# Patient Record
Sex: Male | Born: 1955 | Race: White | Hispanic: No | Smoking: Never smoker
Health system: Southern US, Community
[De-identification: ages and names within clinical notes are randomized; demographics above are authoritative.]

## PROBLEM LIST (undated history)

## (undated) DIAGNOSIS — Z86010 Personal history of colonic polyps: Secondary | ICD-10-CM

## (undated) HISTORY — DX: Personal history of colonic polyps: Z86.010

---

## 1999-01-30 ENCOUNTER — Emergency Department (HOSPITAL_COMMUNITY): Admission: EM | Admit: 1999-01-30 | Discharge: 1999-01-30 | Payer: Self-pay | Admitting: Emergency Medicine

## 1999-01-30 ENCOUNTER — Encounter: Payer: Self-pay | Admitting: Emergency Medicine

## 2006-09-05 DIAGNOSIS — Z860100 Personal history of colon polyps, unspecified: Secondary | ICD-10-CM

## 2006-09-05 HISTORY — DX: Personal history of colon polyps, unspecified: Z86.0100

## 2006-09-05 HISTORY — DX: Personal history of colonic polyps: Z86.010

## 2006-09-05 HISTORY — PX: COLONOSCOPY W/ POLYPECTOMY: SHX1380

## 2006-12-07 ENCOUNTER — Ambulatory Visit: Payer: Self-pay | Admitting: Internal Medicine

## 2006-12-07 LAB — CONVERTED CEMR LAB
ALT: 10 units/L (ref 0–40)
AST: 23 units/L (ref 0–37)
Albumin: 4.2 g/dL (ref 3.5–5.2)
Alkaline Phosphatase: 44 units/L (ref 39–117)
BUN: 15 mg/dL (ref 6–23)
Basophils Absolute: 0 10*3/uL (ref 0.0–0.1)
Basophils Relative: 0.8 % (ref 0.0–1.0)
Bilirubin, Direct: 0.2 mg/dL (ref 0.0–0.3)
CO2: 29 meq/L (ref 19–32)
Calcium: 9.3 mg/dL (ref 8.4–10.5)
Chloride: 104 meq/L (ref 96–112)
Cholesterol: 168 mg/dL (ref 0–200)
Creatinine, Ser: 1 mg/dL (ref 0.4–1.5)
Eosinophils Absolute: 0.2 10*3/uL (ref 0.0–0.6)
Eosinophils Relative: 4.5 % (ref 0.0–5.0)
GFR calc Af Amer: 101 mL/min
GFR calc non Af Amer: 84 mL/min
Glucose, Bld: 108 mg/dL — ABNORMAL HIGH (ref 70–99)
HCT: 47.2 % (ref 39.0–52.0)
HDL: 51.4 mg/dL (ref >39.0)
Hemoglobin: 16.3 g/dL (ref 13.0–17.0)
LDL Cholesterol: 104 mg/dL — ABNORMAL HIGH (ref 0–99)
Lymphocytes Relative: 31.8 % (ref 12.0–46.0)
MCHC: 34.6 g/dL (ref 30.0–36.0)
MCV: 92.6 fL (ref 78.0–100.0)
Monocytes Absolute: 0.5 10*3/uL (ref 0.2–0.7)
Monocytes Relative: 8.7 % (ref 3.0–11.0)
Neutro Abs: 3 10*3/uL (ref 1.4–7.7)
Neutrophils Relative %: 54.2 % (ref 43.0–77.0)
PSA: 1.11 ng/mL (ref 0.10–4.00)
Platelets: 250 10*3/uL (ref 150–400)
Potassium: 3.8 meq/L (ref 3.5–5.1)
RBC: 5.09 M/uL (ref 4.22–5.81)
RDW: 11.7 % (ref 11.5–14.6)
Sodium: 139 meq/L (ref 135–145)
TSH: 2.39 microintl units/mL (ref 0.35–5.50)
Total Bilirubin: 1 mg/dL (ref 0.3–1.2)
Total CHOL/HDL Ratio: 3.3
Total Protein: 6.7 g/dL (ref 6.0–8.3)
Triglycerides: 61 mg/dL (ref 0–149)
VLDL: 12 mg/dL (ref 0–40)
WBC: 5.4 10*3/uL (ref 4.5–10.5)

## 2007-01-04 ENCOUNTER — Ambulatory Visit: Payer: Self-pay | Admitting: Internal Medicine

## 2007-01-18 ENCOUNTER — Ambulatory Visit: Payer: Self-pay | Admitting: Internal Medicine

## 2007-01-18 ENCOUNTER — Encounter: Payer: Self-pay | Admitting: Internal Medicine

## 2008-12-16 ENCOUNTER — Ambulatory Visit: Payer: Self-pay | Admitting: Internal Medicine

## 2008-12-16 DIAGNOSIS — Z860100 Personal history of colon polyps, unspecified: Secondary | ICD-10-CM | POA: Insufficient documentation

## 2008-12-16 LAB — CONVERTED CEMR LAB
Bilirubin Urine: NEGATIVE
Blood in Urine, dipstick: NEGATIVE
Glucose, Urine, Semiquant: NEGATIVE
Ketones, urine, test strip: NEGATIVE
Nitrite: NEGATIVE
Protein, U semiquant: NEGATIVE
Specific Gravity, Urine: 1.01
Urobilinogen, UA: 0.2
WBC Urine, dipstick: NEGATIVE
pH: 6

## 2009-12-16 ENCOUNTER — Encounter: Payer: Self-pay | Admitting: Internal Medicine

## 2010-10-05 NOTE — Letter (Signed)
Summary: Medical Certificate Third Class/FAA  Medical Certificate Third Class/FAA   Imported By: Lanelle Bal 12/21/2009 11:45:04  _____________________________________________________________________  External Attachment:    Type:   Image     Comment:   External Document

## 2010-12-22 ENCOUNTER — Telehealth: Payer: Self-pay

## 2010-12-22 ENCOUNTER — Ambulatory Visit (INDEPENDENT_AMBULATORY_CARE_PROVIDER_SITE_OTHER): Payer: BC Managed Care – PPO | Admitting: Internal Medicine

## 2010-12-22 ENCOUNTER — Encounter: Payer: Self-pay | Admitting: Internal Medicine

## 2010-12-22 DIAGNOSIS — Z0000 Encounter for general adult medical examination without abnormal findings: Secondary | ICD-10-CM

## 2010-12-22 DIAGNOSIS — N429 Disorder of prostate, unspecified: Secondary | ICD-10-CM

## 2010-12-22 DIAGNOSIS — R7309 Other abnormal glucose: Secondary | ICD-10-CM

## 2010-12-22 NOTE — Patient Instructions (Signed)
Please use the exercises and arch supports for plantar fasciitis.

## 2010-12-22 NOTE — Progress Notes (Signed)
Subjective:    Patient ID: Marcus Raymond, male    DOB: 1955/11/20, 55 y.o.   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    Review of Systems Patient reports no  vision/ hearing changes,anorexia, weight change, fever ,adenopathy, persistant / recurrent hoarseness, swallowing issues, chest pain,palpitations, edema,persistant / recurrent cough, hemoptysis, dyspnea(rest, exertional, paroxysmal nocturnal), gastrointestinal  bleeding (melena, rectal bleeding), abdominal pain, excessive heart burn, GU symptoms( dysuria, hematuria, pyuria, voiding/incontinence  issues) syncope, focal weakness, memory loss,numbness & tingling, skin/hair/nail changes,depression, anxiety, abnormal bruising/bleeding, musculoskeletal symptoms/signs( see Plantar Fasciitis issue)     Objective:   Physical Exam Gen.: Thin but healthy and well-nourished in appearance. Alert, appropriate and cooperative throughout exam. Head: Normocephalic without obvious abnormalities;  no alopecia  Eyes: No corneal or conjunctival inflammation noted. Pupils equal round reactive to light and accommodation. Fundal exam is benign without hemorrhages, exudate, papilledema. Extraocular motion intact. Vision grossly normal. Ears: External  ear exam reveals no significant lesions or deformities. Canals clear .TMs normal. Hearing is grossly normal bilaterally. Nose: External nasal exam reveals no deformity or inflammation. Nasal mucosa are pink and moist. No lesions or exudates noted. Septum   Minimal  dislocation to L.  Mouth: Oral mucosa and oropharynx reveal no lesions or exudates. Teeth in good repair. Neck: No deformities, masses, or tenderness noted. Range of motion normal. Thyroid w/o nodules. Lungs: Normal respiratory effort; chest expands symmetrically. Lungs are clear to auscultation without rales, wheezes, or increased work of breathing. Heart: Normal  rate and rhythm. Prominent  S1; normal  S2. No gallop, click, or rub.  No murmur. Abdomen: Bowel sounds normal; abdomen soft and nontender. No masses, organomegaly or hernias noted. External hemorrhoidal tags Genitalia: Normal genitalia; L prostate lobe firm w/o nodules or enlargement.                                                                                     Musculoskeletal/extremities: No deformity or scoliosis noted of  the thoracic or lumbar spine. No clubbing, cyanosis, edema, or deformity noted. Range of motion  normal .Tone & strength  normal.Joints normal. Nail health  good. Vascular: Carotid, radial artery, dorsalis pedis and dorsalis posterior tibial pulses are full and equal.Aorta palpable w/o AAA. No bruits present. Neurologic: Alert and oriented x3. Deep tendon reflexes symmetrical and normal.  Romberg  ;finger to nose normal.        Skin: Intact without suspicious lesions or rashes. Lymph: No cervical, axillary, or inguinal lymphadenopathy present. Psych: Mood and affect are normal. Normally interactive                                                                                         Assessment & Plan:  #1 comprehensive  physical exam; no contraindication to [facility] certification  #2 subtle changes in the left lobe of the prostate  #3 fasting hyperglycemia, past medical history of  Plan:#1 His data will be entered into the [facility] computer system & certificate mailed to him  #2 A1c and PSA will be collected.

## 2010-12-22 NOTE — Telephone Encounter (Signed)
I called patient and left a voicemail for a return call. 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      KP

## 2010-12-23 LAB — PSA: PSA: 0.72 ng/mL (ref 0.10–4.00)

## 2010-12-23 LAB — HEMOGLOBIN A1C: Hgb A1c MFr Bld: 5.9 % (ref 4.6–6.5)

## 2010-12-23 NOTE — Telephone Encounter (Signed)
[facility]-Triage assistant spoke with patient and he will come in tomorrow for urine dip

## 2010-12-24 LAB — POCT URINALYSIS DIPSTICK
Bilirubin, UA: NEGATIVE
Blood, UA: NEGATIVE
Glucose, UA: NEGATIVE
Ketones, UA: NEGATIVE
Leukocytes, UA: NEGATIVE
Nitrite, UA: NEGATIVE
Protein, UA: NEGATIVE
Spec Grav, UA: 1.005
Urobilinogen, UA: NEGATIVE
pH, UA: 7

## 2010-12-24 NOTE — Progress Notes (Signed)
Addended by: Floydene Flock on: 12/24/2010 03:00 PM   Modules accepted: Orders

## 2011-01-21 NOTE — Assessment & Plan Note (Signed)
[facility]                        [place] [facility] OFFICE NOTE   Marcus, Raymond                    MRN:          [id number]  DATE:12/07/2006                            DOB:          [birth year]    Marcus Raymond was seen for a comprehensive physical exam and FA  exam on December 07, 2006.   He is asymptomatic.  He exercises at an extremely high level, running at  least 40 miles per week, with no cardiopulmonary symptoms.  He is on a  heart healthy diet.   He has never been hospitalized.  He was treated for a dislocated  shoulder as an outpatient.   There is no family history of heart attack, stroke, diabetes or cancer.  His mother has hypertension.  He is on multivitamins and no other  medications.  HE IS INTOLERANT OR ALLERGIC TO SULFA.  He will drink a  beer a day; never smoked.   REVIEW OF SYSTEMS:  Is totally negative; it was reviewed in detail.   He is 5 feet 7 inches, weighs 134 fully clothed.  Temperature is 97.6,  pulse 62 and regular, respiratory rate 12, blood pressure 122/80.  Fundal exam is normal.  Dental hygiene is excellent.  Otolaryngologic  exam is unremarkable.  THYROID:  Slightly small, without nodules.  CHEST:  Clear; there is a click @ the apex with no regurgitation or  dysrhythmia.  He has no organomegaly or masses.  His abdomen is well muscled and  scaphoid ; there is no aneurysm.  GENITOURINARY:  Negative.  Prostate is upper limits of normal on the  right and firm.  Hemoccult testing is negative, although there is some  hemorrhoidal tissue.  MUSCULOSKELETAL:  Unremarkable.  NEURO/PSYCHIATRIC:  Negative.   He will be given the option of having comprehensive labs.  At a minimum,  it is recommended to him that he have a PSA; it is also recommended that  he consider having a colonoscopy for surveillance.  FAA certificate will  be issued.     Titus Dubin. Alwyn Ren, MD,FACP,FCCP  Electronically Signed    [facility]/MedQ  DD: 12/07/2006  DT: 12/07/2006  Job #: [id number]

## 2011-12-29 ENCOUNTER — Encounter: Payer: Self-pay | Admitting: Internal Medicine

## 2012-07-25 ENCOUNTER — Emergency Department (HOSPITAL_COMMUNITY)
Admission: EM | Admit: 2012-07-25 | Discharge: 2012-07-25 | Disposition: A | Discharge status: Home / Self Care | Payer: BC Managed Care – PPO | Source: Home / Self Care | Attending: Emergency Medicine | Admitting: Emergency Medicine

## 2012-07-25 ENCOUNTER — Encounter (HOSPITAL_COMMUNITY): Payer: Self-pay | Admitting: *Deleted

## 2012-07-25 ENCOUNTER — Emergency Department (INDEPENDENT_AMBULATORY_CARE_PROVIDER_SITE_OTHER): Payer: BC Managed Care – PPO

## 2012-07-25 DIAGNOSIS — S52023A Displaced fracture of olecranon process without intraarticular extension of unspecified ulna, initial encounter for closed fracture: Secondary | ICD-10-CM

## 2012-07-25 MED ORDER — HYDROCODONE-ACETAMINOPHEN 5-500 MG PO TABS: 1.0000 | ORAL_TABLET | Freq: Four times a day (QID) | ORAL | Status: DC | PRN

## 2012-07-25 NOTE — ED Notes (Signed)
Accessed for ortho tech

## 2012-07-25 NOTE — ED Provider Notes (Signed)
History     CSN: [id number]  Arrival date & time 07/25/12  1150   First MD Initiated Contact with Patient 07/25/12 1312      Chief Complaint  Patient presents with  . Fall  . Joint Swelling    (Consider location/radiation/quality/duration/timing/severity/associated sxs/prior treatment) Patient is a 56 y.o. male presenting with fall. The history is provided by the patient.  Fall The accident occurred 12 to 24 hours ago. He fell from a height of 1 to 2 ft. He landed on concrete. There was no blood loss. The point of impact was the left elbow. The pain is present in the left elbow. The pain is at a severity of 6/10. The pain is moderate. He was not ambulatory at the scene. There was no entrapment after the fall. There was no alcohol use involved in the accident. Associated symptoms include tingling. Pertinent negatives include no numbness, no headaches, no hearing loss and no loss of consciousness. The symptoms are aggravated by activity. The treatment provided no relief.    Past Medical History  Diagnosis Date  . History of colon polyps 2008    Tubular Adenoma, Dr Marina Goodell , due 2013    Past Surgical History  Procedure Date  . Colonoscopy w/ polypectomy 2008    Tubular Adenoma    Family History  Problem Relation Age of Onset  . Hypertension Mother   . Skin cancer Mother     basal cell  . Colon polyps Father     History  Substance Use Topics  . Smoking status: Never Smoker   . Smokeless tobacco: Not on file  . Alcohol Use: 6.0 oz/week    10 Cans of beer per week      Review of Systems  Constitutional: Positive for activity change.  Musculoskeletal: Negative for gait problem.  Skin: Negative for color change and pallor.  Neurological: Positive for tingling. Negative for dizziness, tremors, seizures, loss of consciousness, syncope, speech difficulty, weakness, light-headedness, numbness and headaches.    Allergies  Sulfonamide derivatives  Home Medications  No  current outpatient prescriptions on file.  BP 135/89  Pulse 64  Temp 97.4 F (36.3 C) (Oral)  Resp 18  SpO2 100%  Physical Exam  Nursing note and vitals reviewed. Constitutional: Vital signs are normal. He appears well-developed and well-nourished.  Non-toxic appearance. He does not have a sickly appearance. He does not appear ill. No distress.  Musculoskeletal: He exhibits tenderness.       Left forearm: He exhibits tenderness, bony tenderness, swelling and deformity. He exhibits no edema and no laceration.       Arms: Neurological: He is alert.  Skin: No rash noted. No erythema.    ED Course  Procedures (including critical care time)  Labs Reviewed - No data to display Dg Elbow Complete Left  07/25/2012  *RADIOLOGY REPORT*  Clinical Data: Trauma and pain.  Abrasions to posterior elbow with swelling.  LEFT ELBOW - COMPLETE 3+ VIEW  Comparison: None.  Findings: Soft tissue swelling is seen posteriorly and about the ulnar side of the joint.  There is a transverse fracture of the olecranon process of the ulna, with approximately 2.4 cm of distraction of the proximal fracture fragment proximally. No joint effusion.  IMPRESSION: Olecranon process fracture with soft tissue swelling and distraction.   Original Report Authenticated By: Jeronimo Greaves, M.D.      1. Olecranon fracture       MDM   Left complete olecranon transverse fracture post fall. Patient  has been put in a posterior splint case has been discussed with Dr. Magnus Ivan which will see patient tomorrow.  Exam demonstrated no neural vascular deficits distally or proximal to the fracture site.       Jimmie Molly, MD 07/25/12 [phone]

## 2012-07-25 NOTE — ED Notes (Signed)
Pt reports falling onto arms while running this morning - abrasions to left elbow, hematoma and swelling with tightness in left arm

## 2012-07-26 ENCOUNTER — Encounter (HOSPITAL_COMMUNITY): Payer: Self-pay | Admitting: *Deleted

## 2012-07-26 ENCOUNTER — Other Ambulatory Visit (HOSPITAL_COMMUNITY): Payer: Self-pay | Admitting: Orthopaedic Surgery

## 2012-07-26 NOTE — Progress Notes (Signed)
Patient states just had Hepatitis A vaccine on 07/25/12.   Patient to bring in Typhoid regimen he started on 07/25/12.

## 2012-07-27 ENCOUNTER — Ambulatory Visit (HOSPITAL_COMMUNITY): Payer: BC Managed Care – PPO

## 2012-07-27 ENCOUNTER — Ambulatory Visit (HOSPITAL_COMMUNITY)
Admission: RE | Admit: 2012-07-27 | Discharge: 2012-07-27 | Disposition: A | Discharge status: Home / Self Care | Payer: BC Managed Care – PPO | Source: Ambulatory Visit | Attending: Orthopaedic Surgery | Admitting: Orthopaedic Surgery

## 2012-07-27 ENCOUNTER — Encounter (HOSPITAL_COMMUNITY)
Admission: RE | Disposition: A | Discharge status: Home / Self Care | Payer: Self-pay | Source: Ambulatory Visit | Attending: Orthopaedic Surgery

## 2012-07-27 ENCOUNTER — Encounter (HOSPITAL_COMMUNITY): Payer: Self-pay | Admitting: Anesthesiology

## 2012-07-27 ENCOUNTER — Ambulatory Visit (HOSPITAL_COMMUNITY): Payer: BC Managed Care – PPO | Admitting: Anesthesiology

## 2012-07-27 ENCOUNTER — Encounter (HOSPITAL_COMMUNITY): Payer: Self-pay | Admitting: *Deleted

## 2012-07-27 DIAGNOSIS — S52022A Displaced fracture of olecranon process without intraarticular extension of left ulna, initial encounter for closed fracture: Secondary | ICD-10-CM

## 2012-07-27 DIAGNOSIS — Z860100 Personal history of colon polyps, unspecified: Secondary | ICD-10-CM | POA: Insufficient documentation

## 2012-07-27 DIAGNOSIS — W19XXXA Unspecified fall, initial encounter: Secondary | ICD-10-CM | POA: Insufficient documentation

## 2012-07-27 DIAGNOSIS — S52023A Displaced fracture of olecranon process without intraarticular extension of unspecified ulna, initial encounter for closed fracture: Secondary | ICD-10-CM | POA: Insufficient documentation

## 2012-07-27 DIAGNOSIS — Y9302 Activity, running: Secondary | ICD-10-CM | POA: Insufficient documentation

## 2012-07-27 HISTORY — PX: ORIF ELBOW FRACTURE: SHX5031

## 2012-07-27 LAB — CBC
MCHC: 35.4 g/dL (ref 30.0–36.0)
Platelets: 228 10*3/uL (ref 150–400)
RDW: 12 % (ref 11.5–15.5)
WBC: 7.7 10*3/uL (ref 4.0–10.5)

## 2012-07-27 LAB — SURGICAL PCR SCREEN
MRSA, PCR: NEGATIVE
Staphylococcus aureus: NEGATIVE

## 2012-07-27 SURGERY — OPEN REDUCTION INTERNAL FIXATION (ORIF) ELBOW/OLECRANON FRACTURE
Anesthesia: General | Laterality: Left | Wound class: Clean

## 2012-07-27 MED ORDER — ACETAMINOPHEN 10 MG/ML IV SOLN
INTRAVENOUS | Status: AC
  Filled 2012-07-27: qty 100

## 2012-07-27 MED ORDER — FENTANYL CITRATE 0.05 MG/ML IJ SOLN
INTRAMUSCULAR | Status: DC | PRN
  Administered 2012-07-27 (×2): 100 ug via INTRAVENOUS
  Administered 2012-07-27: 50 ug via INTRAVENOUS

## 2012-07-27 MED ORDER — HYDROMORPHONE HCL PF 1 MG/ML IJ SOLN
INTRAMUSCULAR | Status: AC
  Filled 2012-07-27: qty 1

## 2012-07-27 MED ORDER — BUPIVACAINE HCL (PF) 0.25 % IJ SOLN
INTRAMUSCULAR | Status: AC
  Filled 2012-07-27: qty 30

## 2012-07-27 MED ORDER — OXYCODONE-ACETAMINOPHEN 5-325 MG PO TABS: 1.0000 | ORAL_TABLET | ORAL | Status: AC | PRN

## 2012-07-27 MED ORDER — OXYCODONE-ACETAMINOPHEN 5-325 MG PO TABS
ORAL_TABLET | ORAL | Status: AC
  Filled 2012-07-27: qty 2

## 2012-07-27 MED ORDER — LACTATED RINGERS IV SOLN
INTRAVENOUS | Status: DC
  Administered 2012-07-27: 1000 mL via INTRAVENOUS

## 2012-07-27 MED ORDER — 0.9 % SODIUM CHLORIDE (POUR BTL) OPTIME
TOPICAL | Status: DC | PRN
  Administered 2012-07-27: 1000 mL

## 2012-07-27 MED ORDER — PROPOFOL 10 MG/ML IV BOLUS
INTRAVENOUS | Status: DC | PRN
  Administered 2012-07-27: 200 mg via INTRAVENOUS

## 2012-07-27 MED ORDER — MIDAZOLAM HCL 5 MG/5ML IJ SOLN
INTRAMUSCULAR | Status: DC | PRN
  Administered 2012-07-27: 2 mg via INTRAVENOUS

## 2012-07-27 MED ORDER — HYDROMORPHONE HCL PF 1 MG/ML IJ SOLN
0.2500 mg | INTRAMUSCULAR | Status: DC | PRN
  Administered 2012-07-27 (×4): 0.5 mg via INTRAVENOUS

## 2012-07-27 MED ORDER — CEFAZOLIN SODIUM-DEXTROSE 2-3 GM-% IV SOLR
2.0000 g | INTRAVENOUS | Status: AC
  Administered 2012-07-27: 2 g via INTRAVENOUS

## 2012-07-27 MED ORDER — MUPIROCIN 2 % EX OINT
TOPICAL_OINTMENT | Freq: Two times a day (BID) | CUTANEOUS | Status: DC
  Administered 2012-07-27: 1 via NASAL
  Filled 2012-07-27 (×2): qty 22

## 2012-07-27 MED ORDER — CEFAZOLIN SODIUM-DEXTROSE 2-3 GM-% IV SOLR
INTRAVENOUS | Status: AC
  Filled 2012-07-27: qty 50

## 2012-07-27 MED ORDER — LACTATED RINGERS IV SOLN: INTRAVENOUS | Status: DC

## 2012-07-27 MED ORDER — ONDANSETRON HCL 4 MG/2ML IJ SOLN
INTRAMUSCULAR | Status: DC | PRN
  Administered 2012-07-27: 4 mg via INTRAVENOUS

## 2012-07-27 MED ORDER — LIDOCAINE HCL (CARDIAC) 20 MG/ML IV SOLN
INTRAVENOUS | Status: DC | PRN
  Administered 2012-07-27: 50 mg via INTRAVENOUS

## 2012-07-27 MED ORDER — BUPIVACAINE HCL (PF) 0.25 % IJ SOLN
INTRAMUSCULAR | Status: DC | PRN
  Administered 2012-07-27: 20 mL

## 2012-07-27 MED ORDER — ACETAMINOPHEN 10 MG/ML IV SOLN
INTRAVENOUS | Status: DC | PRN
  Administered 2012-07-27: 1000 mg via INTRAVENOUS

## 2012-07-27 MED ORDER — PROMETHAZINE HCL 12.5 MG PO TABS: 12.5000 mg | ORAL_TABLET | Freq: Four times a day (QID) | ORAL | Status: DC | PRN

## 2012-07-27 MED ORDER — LACTATED RINGERS IV SOLN
INTRAVENOUS | Status: DC | PRN
  Administered 2012-07-27 (×2): via INTRAVENOUS

## 2012-07-27 SURGICAL SUPPLY — 53 items
BAG ZIPLOCK 12X15 (MISCELLANEOUS) ×2 IMPLANT
BANDAGE ELASTIC 4 VELCRO ST LF (GAUZE/BANDAGES/DRESSINGS) ×2 IMPLANT
BIT DRILL 2.5X2.75 QC CALB (BIT) ×2 IMPLANT
BIT DRILL CALIBRATED 2.7 (BIT) ×2 IMPLANT
CLOTH BEACON ORANGE TIMEOUT ST (SAFETY) ×2 IMPLANT
COVER SURGICAL LIGHT HANDLE (MISCELLANEOUS) IMPLANT
DRAPE POUCH INSTRU U-SHP 10X18 (DRAPES) ×2 IMPLANT
DRAPE U-SHAPE 47X51 STRL (DRAPES) ×2 IMPLANT
DRSG PAD ABDOMINAL 8X10 ST (GAUZE/BANDAGES/DRESSINGS) ×2 IMPLANT
DURAPREP 26ML APPLICATOR (WOUND CARE) ×2 IMPLANT
ELECT REM PT RETURN 9FT ADLT (ELECTROSURGICAL) ×2
ELECTRODE REM PT RTRN 9FT ADLT (ELECTROSURGICAL) ×1 IMPLANT
GAUZE XEROFORM 5X9 LF (GAUZE/BANDAGES/DRESSINGS) ×2 IMPLANT
GLOVE BIO SURGEON STRL SZ7 (GLOVE) ×2 IMPLANT
GLOVE BIO SURGEON STRL SZ7.5 (GLOVE) ×4 IMPLANT
GOWN STRL REIN XL XLG (GOWN DISPOSABLE) ×4 IMPLANT
K-WIRE FIXATION 2.0X6 (WIRE) ×8
KIT BASIN OR (CUSTOM PROCEDURE TRAY) ×2 IMPLANT
KWIRE FIXATION 2.0X6 (WIRE) ×4 IMPLANT
MANIFOLD NEPTUNE II (INSTRUMENTS) ×2 IMPLANT
NEEDLE HYPO 22GX1.5 SAFETY (NEEDLE) ×2 IMPLANT
NS IRRIG 1000ML POUR BTL (IV SOLUTION) ×2 IMPLANT
PACK LOWER EXTREMITY WL (CUSTOM PROCEDURE TRAY) ×2 IMPLANT
PAD CAST 4YDX4 CTTN HI CHSV (CAST SUPPLIES) ×1 IMPLANT
PADDING CAST COTTON 4X4 STRL (CAST SUPPLIES) ×1
PLATE OLECRANON SM (Plate) ×2 IMPLANT
POSITIONER SURGICAL ARM (MISCELLANEOUS) ×2 IMPLANT
SCREW CORT T15 24X3.5XST LCK (Screw) ×1 IMPLANT
SCREW CORTICAL 3.5X24MM (Screw) ×1 IMPLANT
SCREW LOCK CORT STAR 3.5X12 (Screw) ×6 IMPLANT
SCREW LOW PROFILE 22MMX3.5MM (Screw) ×2 IMPLANT
SCREW LP 3.5X60MM (Screw) ×2 IMPLANT
SPLINT PLASTER CAST XFAST 5X30 (CAST SUPPLIES) ×1 IMPLANT
SPLINT PLASTER XFAST SET 5X30 (CAST SUPPLIES) ×1
SPONGE GAUZE 4X4 12PLY (GAUZE/BANDAGES/DRESSINGS) ×2 IMPLANT
SPONGE LAP 18X18 X RAY DECT (DISPOSABLE) ×2 IMPLANT
SPONGE LAP 4X18 X RAY DECT (DISPOSABLE) IMPLANT
STAPLER VISISTAT (STAPLE) ×2 IMPLANT
STOCKINETTE 8 INCH (MISCELLANEOUS) ×2 IMPLANT
STRIP CLOSURE SKIN 1/2X4 (GAUZE/BANDAGES/DRESSINGS) IMPLANT
SUT ETHILON 2 0 PS N (SUTURE) ×6 IMPLANT
SUT FIBERWIRE #2 38 T-5 BLUE (SUTURE)
SUT VIC AB 0 CT1 27 (SUTURE) ×1
SUT VIC AB 0 CT1 27XBRD ANTBC (SUTURE) ×1 IMPLANT
SUT VIC AB 1 CT1 27 (SUTURE)
SUT VIC AB 1 CT1 27XBRD ANTBC (SUTURE) IMPLANT
SUT VIC AB 2-0 CT2 27 (SUTURE) ×2 IMPLANT
SUT VIC AB 4-0 PS2 27 (SUTURE) IMPLANT
SUTURE FIBERWR #2 38 T-5 BLUE (SUTURE) IMPLANT
SYR 20CC LL (SYRINGE) ×2 IMPLANT
TOWEL OR 17X26 10 PK STRL BLUE (TOWEL DISPOSABLE) ×4 IMPLANT
WASHER 3.5MM (Orthopedic Implant) ×2 IMPLANT
WATER STERILE IRR 1500ML POUR (IV SOLUTION) ×2 IMPLANT

## 2012-07-27 NOTE — Brief Op Note (Signed)
07/27/2012  6:27 PM  PATIENT:  Marcus Raymond  56 y.o. male  PRE-OPERATIVE DIAGNOSIS:  Left elbow olecranon fracture  POST-OPERATIVE DIAGNOSIS:  Left elbow olecranon fracture  PROCEDURE:  Procedure(s) (LRB) with comments: OPEN REDUCTION INTERNAL FIXATION (ORIF) ELBOW/OLECRANON FRACTURE (Left) - Open Reduction Internal Fixation left elbow olecranon  SURGEON:  Surgeon(s) and Role:    * [name], MD - Primary  PHYSICIAN ASSISTANT:   ASSISTANTS: none   ANESTHESIA:   local and general  EBL:  Total I/O In: 1000 [I.V.:1000] Out: -   BLOOD ADMINISTERED:none  DRAINS: none   LOCAL MEDICATIONS USED:  MARCAINE     SPECIMEN:  No Specimen  DISPOSITION OF SPECIMEN:  N/A  COUNTS:  YES  TOURNIQUET:  * No tourniquets in log *  DICTATION: .Other Dictation: Dictation Number [id number]  PLAN OF CARE: Discharge to home after PACU  PATIENT DISPOSITION:  PACU - hemodynamically stable.   Delay start of Pharmacological VTE agent (>24hrs) due to surgical blood loss or risk of bleeding: not applicable

## 2012-07-27 NOTE — Transfer of Care (Signed)
Immediate Anesthesia Transfer of Care Note  Patient: Marcus Raymond  Procedure(s) Performed: Procedure(s) (LRB) with comments: OPEN REDUCTION INTERNAL FIXATION (ORIF) ELBOW/OLECRANON FRACTURE (Left) - Open Reduction Internal Fixation left elbow olecranon  Patient Location: PACU  Anesthesia Type:General  Level of Consciousness: awake and alert   Airway & Oxygen Therapy: Patient Spontanous Breathing and Patient connected to face mask oxygen  Post-op Assessment: Report given to PACU RN and Post -op Vital signs reviewed and stable  Post vital signs: Reviewed and stable  Complications: No apparent anesthesia complications

## 2012-07-27 NOTE — Anesthesia Preprocedure Evaluation (Addendum)
Anesthesia Evaluation  Patient identified by MRN, date of birth, ID band Patient awake    Reviewed: Allergy & Precautions, H&P , NPO status , Patient's Chart, lab work & pertinent test results  Airway Mallampati: II TM Distance: >3 FB Neck ROM: full    Dental  (+) Chipped and Dental Advisory Given,    Pulmonary neg pulmonary ROS,  breath sounds clear to auscultation  Pulmonary exam normal       Cardiovascular Exercise Tolerance: Good negative cardio ROS  Rhythm:regular Rate:Normal     Neuro/Psych negative neurological ROS  negative psych ROS   GI/Hepatic negative GI ROS, Neg liver ROS,   Endo/Other  negative endocrine ROS  Renal/GU negative Renal ROS  negative genitourinary   Musculoskeletal   Abdominal   Peds  Hematology negative hematology ROS (+)   Anesthesia Other Findings   Reproductive/Obstetrics negative OB ROS                          Anesthesia Physical Anesthesia Plan  ASA: I  Anesthesia Plan: General   Post-op Pain Management:    Induction: Intravenous  Airway Management Planned: LMA  Additional Equipment:   Intra-op Plan:   Post-operative Plan:   Informed Consent: I have reviewed the patients History and Physical, chart, labs and discussed the procedure including the risks, benefits and alternatives for the proposed anesthesia with the patient or authorized representative who has indicated his/her understanding and acceptance.   Dental Advisory Given  Plan Discussed with: CRNA and Surgeon  Anesthesia Plan Comments:         Anesthesia Quick Evaluation  

## 2012-07-27 NOTE — Anesthesia Postprocedure Evaluation (Signed)
  Anesthesia Post-op Note  Patient: Marcus Raymond  Procedure(s) Performed: Procedure(s) (LRB): OPEN REDUCTION INTERNAL FIXATION (ORIF) ELBOW/OLECRANON FRACTURE (Left)  Patient Location: PACU  Anesthesia Type: General  Level of Consciousness: awake and alert   Airway and Oxygen Therapy: Patient Spontanous Breathing  Post-op Pain: mild  Post-op Assessment: Post-op Vital signs reviewed, Patient's Cardiovascular Status Stable, Respiratory Function Stable, Patent Airway and No signs of Nausea or vomiting  Last Vitals:  Filed Vitals:   07/27/12 1900  BP: 148/80  Pulse: 62  Temp:   Resp: 14    Post-op Vital Signs: stable   Complications: No apparent anesthesia complications

## 2012-07-27 NOTE — Progress Notes (Signed)
Patient called in to see if could go ahead and take Typhoid Vaccine that he started on 07/25/12 for international travel.  Conversed with another RN, Wilson Singer and instructed patient to go ahead and take Typhoid Vaccine which is Vivotif Berna ED one capsule every other day.  Patient will take with a sip of water.

## 2012-07-27 NOTE — H&P (Signed)
Marcus Raymond is an 56 y.o. male.   Chief Complaint:   Left elbow pain; known olecranon fracture HPI:   56 yo avid runner who accidentally tripped while running this past Wednesday and fell on his left elbow.  Went to urgent care and was found to have a displaced left olecranon fracture.  He now presents for surgery on this unstable injury.  Past Medical History  Diagnosis Date  . History of colon polyps 2008    Tubular Adenoma, Dr Marina Goodell , due 2013    Past Surgical History  Procedure Date  . Colonoscopy w/ polypectomy 2008    Tubular Adenoma    Family History  Problem Relation Age of Onset  . Hypertension Mother   . Skin cancer Mother     basal cell  . Colon polyps Father    Social History:  reports that he has never smoked. He has never used smokeless tobacco. He reports that he drinks alcohol. He reports that he does not use illicit drugs.  Allergies:  Allergies  Allergen Reactions  . Sulfonamide Derivatives     Medications Prior to Admission  Medication Sig Dispense Refill  . Hepatitis A Vaccine 25 UNIT/0.5ML SUSP Inject 1 mL into the muscle.      Marcus Raymond HYDROcodone-acetaminophen (VICODIN) 5-500 MG per tablet Take 1-2 tablets by mouth every 6 (six) hours as needed for pain.  15 tablet  0  . ibuprofen (ADVIL,MOTRIN) 200 MG tablet Take 200 mg by mouth every 6 (six) hours as needed.      . Multiple Vitamins-Minerals (MULTIVITAMIN WITH MINERALS) tablet Take 1 tablet by mouth daily.      . Typhoid Vaccine (VIVOTIF BERNA VACCINE PO) Take by mouth. Patient takes every other day started on 07/25/12 for international travel.        Results for orders placed during the hospital encounter of 07/27/12 (from the past 48 hour(s))  SURGICAL PCR SCREEN     Status: Normal   Collection Time   07/27/12  2:49 PM      Component Value Range Comment   MRSA, PCR NEGATIVE  NEGATIVE    Staphylococcus aureus NEGATIVE  NEGATIVE   CBC     Status: Normal   Collection Time   07/27/12  2:49 PM     Component Value Range Comment   WBC 7.7  4.0 - 10.5 K/uL    RBC 4.59  4.22 - 5.81 MIL/uL    Hemoglobin 14.6  13.0 - 17.0 g/dL    HCT 14.7  82.9 - 56.2 %    MCV 90.0  78.0 - 100.0 fL    MCH 31.8  26.0 - 34.0 pg    MCHC 35.4  30.0 - 36.0 g/dL    RDW 13.0  86.5 - 78.4 %    Platelets 228  150 - 400 K/uL    No results found.  Review of Systems  All other systems reviewed and are negative.    Blood pressure 135/76, pulse 59, temperature 98.3 F (36.8 C), temperature source Oral, resp. rate 14, height 5\' 7"  (1.702 m), weight 61.292 kg (135 lb 2 oz), SpO2 100.00%. Physical Exam  Constitutional: He is oriented to person, place, and time. He appears well-developed and well-nourished.  HENT:  Head: Normocephalic and atraumatic.  Eyes: EOM are normal. Pupils are equal, round, and reactive to light.  Neck: Normal range of motion. Neck supple.  Cardiovascular: Normal rate and regular rhythm.   Respiratory: Effort normal and breath sounds normal.  GI:  Soft. Bowel sounds are normal.  Musculoskeletal:       Left elbow: He exhibits swelling and deformity. tenderness found. Olecranon process tenderness noted.  Neurological: He is alert and oriented to person, place, and time.  Skin: Skin is warm and dry.  Psychiatric: He has a normal mood and affect.     Assessment/Plan Left elbow with unstable, displaced olecranon fracture 1) to the OR for ORIF of left elbow olecranon fracture (informed consent obtained after risks and benefits of surgery fully explained.  [name] 07/27/2012, 4:22 PM

## 2012-07-28 NOTE — Op Note (Signed)
Marcus Raymond, Marcus Raymond NO.:  [id number]  MEDICAL RECORD NO.:  [id number]  LOCATION:  [facility]                         FACILITY:  [facility]  PHYSICIAN:  Vanita Panda. Magnus Ivan, M.D.DATE OF BIRTH:  1956/07/20  DATE OF PROCEDURE:  07/27/2012 DATE OF DISCHARGE:                              OPERATIVE REPORT   PREOPERATIVE DIAGNOSIS:  Left elbow displaced olecranon fracture.  POSTOPERATIVE DIAGNOSIS:  Left elbow displaced olecranon fracture.  PROCEDURE:  Open reduction and internal fixation of left elbow displaced olecranon fracture.  IMPLANTS:  [employer] short olecranon plate.  SURGEON:  Vanita Panda. Magnus Ivan, M.D.  ANESTHESIA: 1. General. 2. Local with 0.25% plain Marcaine.  BLOOD LOSS:  Less than 200 mL.  COMPLICATIONS:  None.  INDICATIONS:  Briefly, [name] is a 56 year old avid runner who had a fall this past Wednesday while he was running and accidentally tripped landing on his left elbow.  This is nondominant elbow.  He sustained a displaced olecranon fracture of the proximal aspect of the olecranon.  It was recommended displaced unstable nature of this fracture.  He undergo open reduction and internal fixation.  Risks and benefits of surgery were explained in detail and he did wish to proceed with surgery.  PROCEDURE DESCRIPTION:  After informed consent was obtained, the appropriate left elbow was marked.  He was brought to the operating room, placed supine on the operating table.  General anesthesia was then obtained.  His left arm was prepped and draped from the shoulder down to the wrist with the sterile scrubbed and draped in a sterile stockinette. Time-out was called and he was identified as correct patient, correct left elbow.  I then found a large abrasion over the elbow.  I then made incision over the olecranon and carried this proximally and distally and dissected down to the tip of the olecranon where it was displaced.   I cleaned the elbow joint and hematoma and irrigated thoroughly.  The wound was able to temporarily hold the olecranon piece anatomically reduced with 2 K-wires.  I then placed a Biomet short olecranon plate along the olecranon and secured this proximally and distally screws to hold the fracture completely reduced once I was able to dissect the pronate and supinate the elbow as well as flex and extend without difficulty.  I then irrigated the soft tissue with normal saline solution.  I closed the deep tissue 0 Vicryl placed followed by 2-0 Vicryl in subcutaneous tissue and interrupted 3-0 nylon on the skin. The lower sutures Xeroform was placed over the elbow due to abrasions as well as the well-padded sterile dressing and a plaster splint.  He was awakened, extubated, and taken to the recovery room in stable condition. All final counts were correct and there were no complications noted.     Vanita Panda. Magnus Ivan, M.D.     [facility]/MEDQ  D:  07/27/2012  T:  07/28/2012  Job:  [id number]

## 2012-07-31 ENCOUNTER — Encounter (HOSPITAL_COMMUNITY): Payer: Self-pay | Admitting: Orthopaedic Surgery

## 2012-09-03 ENCOUNTER — Other Ambulatory Visit (HOSPITAL_COMMUNITY): Payer: Self-pay | Admitting: Orthopaedic Surgery

## 2012-09-03 ENCOUNTER — Encounter (HOSPITAL_COMMUNITY): Payer: Self-pay | Admitting: *Deleted

## 2012-09-07 ENCOUNTER — Encounter (HOSPITAL_COMMUNITY): Payer: Self-pay | Admitting: Anesthesiology

## 2012-09-07 ENCOUNTER — Ambulatory Visit (HOSPITAL_COMMUNITY)
Admission: RE | Admit: 2012-09-07 | Discharge: 2012-09-07 | Disposition: A | Discharge status: Home / Self Care | Payer: BC Managed Care – PPO | Source: Ambulatory Visit | Attending: Orthopaedic Surgery | Admitting: Orthopaedic Surgery

## 2012-09-07 ENCOUNTER — Encounter (HOSPITAL_COMMUNITY)
Admission: RE | Disposition: A | Discharge status: Home / Self Care | Payer: Self-pay | Source: Ambulatory Visit | Attending: Orthopaedic Surgery

## 2012-09-07 ENCOUNTER — Encounter (HOSPITAL_COMMUNITY): Payer: Self-pay | Admitting: *Deleted

## 2012-09-07 ENCOUNTER — Ambulatory Visit (HOSPITAL_COMMUNITY): Payer: BC Managed Care – PPO | Admitting: Anesthesiology

## 2012-09-07 DIAGNOSIS — X58XXXA Exposure to other specified factors, initial encounter: Secondary | ICD-10-CM | POA: Insufficient documentation

## 2012-09-07 DIAGNOSIS — S52023A Displaced fracture of olecranon process without intraarticular extension of unspecified ulna, initial encounter for closed fracture: Secondary | ICD-10-CM

## 2012-09-07 DIAGNOSIS — T84498A Other mechanical complication of other internal orthopedic devices, implants and grafts, initial encounter: Secondary | ICD-10-CM | POA: Insufficient documentation

## 2012-09-07 DIAGNOSIS — Y831 Surgical operation with implant of artificial internal device as the cause of abnormal reaction of the patient, or of later complication, without mention of misadventure at the time of the procedure: Secondary | ICD-10-CM | POA: Insufficient documentation

## 2012-09-07 HISTORY — PX: ORIF ELBOW FRACTURE: SHX5031

## 2012-09-07 LAB — SURGICAL PCR SCREEN: MRSA, PCR: NEGATIVE

## 2012-09-07 LAB — CBC
Platelets: 266 10*3/uL (ref 150–400)
RBC: 5.05 MIL/uL (ref 4.22–5.81)
WBC: 7.4 10*3/uL (ref 4.0–10.5)

## 2012-09-07 SURGERY — OPEN REDUCTION INTERNAL FIXATION (ORIF) ELBOW/OLECRANON FRACTURE
Anesthesia: General | Site: Elbow | Laterality: Left | Wound class: Clean

## 2012-09-07 MED ORDER — 0.9 % SODIUM CHLORIDE (POUR BTL) OPTIME
TOPICAL | Status: DC | PRN
  Administered 2012-09-07: 1000 mL

## 2012-09-07 MED ORDER — FENTANYL CITRATE 0.05 MG/ML IJ SOLN
INTRAMUSCULAR | Status: DC | PRN
  Administered 2012-09-07: 50 ug via INTRAVENOUS
  Administered 2012-09-07: 25 ug via INTRAVENOUS
  Administered 2012-09-07: 50 ug via INTRAVENOUS
  Administered 2012-09-07: 25 ug via INTRAVENOUS
  Administered 2012-09-07: 50 ug via INTRAVENOUS

## 2012-09-07 MED ORDER — OXYCODONE-ACETAMINOPHEN 5-325 MG PO TABS: 2.0000 | ORAL_TABLET | ORAL | Status: DC | PRN

## 2012-09-07 MED ORDER — CEFAZOLIN SODIUM-DEXTROSE 2-3 GM-% IV SOLR
INTRAVENOUS | Status: AC
  Filled 2012-09-07: qty 50

## 2012-09-07 MED ORDER — CEFAZOLIN SODIUM-DEXTROSE 2-3 GM-% IV SOLR
2.0000 g | INTRAVENOUS | Status: AC
  Administered 2012-09-07: 2 g via INTRAVENOUS

## 2012-09-07 MED ORDER — ROPIVACAINE HCL 5 MG/ML IJ SOLN
INTRAMUSCULAR | Status: AC
  Filled 2012-09-07: qty 30

## 2012-09-07 MED ORDER — BUPIVACAINE HCL (PF) 0.25 % IJ SOLN
INTRAMUSCULAR | Status: AC
  Filled 2012-09-07: qty 30

## 2012-09-07 MED ORDER — LIDOCAINE HCL (CARDIAC) 20 MG/ML IV SOLN
INTRAVENOUS | Status: DC | PRN
  Administered 2012-09-07: 100 mg via INTRAVENOUS

## 2012-09-07 MED ORDER — FENTANYL CITRATE 0.05 MG/ML IJ SOLN
25.0000 ug | INTRAMUSCULAR | Status: DC | PRN
  Administered 2012-09-07 (×4): 50 ug via INTRAVENOUS

## 2012-09-07 MED ORDER — MUPIROCIN 2 % EX OINT
TOPICAL_OINTMENT | Freq: Two times a day (BID) | CUTANEOUS | Status: DC
  Administered 2012-09-07: 15:00:00 via NASAL
  Filled 2012-09-07: qty 22

## 2012-09-07 MED ORDER — ACETAMINOPHEN 10 MG/ML IV SOLN
INTRAVENOUS | Status: AC
  Filled 2012-09-07: qty 100

## 2012-09-07 MED ORDER — ACETAMINOPHEN 10 MG/ML IV SOLN
INTRAVENOUS | Status: DC | PRN
  Administered 2012-09-07: 1000 mg via INTRAVENOUS

## 2012-09-07 MED ORDER — LACTATED RINGERS IV SOLN: INTRAVENOUS | Status: DC

## 2012-09-07 MED ORDER — KETOROLAC TROMETHAMINE 30 MG/ML IJ SOLN
INTRAMUSCULAR | Status: DC | PRN
  Administered 2012-09-07: 30 mg via INTRAVENOUS

## 2012-09-07 MED ORDER — BUPIVACAINE HCL 0.25 % IJ SOLN
INTRAMUSCULAR | Status: DC | PRN
  Administered 2012-09-07: 5 mL

## 2012-09-07 MED ORDER — FENTANYL CITRATE 0.05 MG/ML IJ SOLN: 50.0000 ug | INTRAMUSCULAR | Status: DC | PRN

## 2012-09-07 MED ORDER — ONDANSETRON HCL 4 MG/2ML IJ SOLN
INTRAMUSCULAR | Status: DC | PRN
  Administered 2012-09-07: 4 mg via INTRAVENOUS

## 2012-09-07 MED ORDER — LACTATED RINGERS IV SOLN
INTRAVENOUS | Status: DC | PRN
  Administered 2012-09-07 (×2): via INTRAVENOUS

## 2012-09-07 MED ORDER — MIDAZOLAM HCL 5 MG/5ML IJ SOLN
INTRAMUSCULAR | Status: DC | PRN
  Administered 2012-09-07: 2 mg via INTRAVENOUS

## 2012-09-07 MED ORDER — FENTANYL CITRATE 0.05 MG/ML IJ SOLN
INTRAMUSCULAR | Status: AC
  Filled 2012-09-07: qty 2

## 2012-09-07 MED ORDER — BUPIVACAINE-EPINEPHRINE PF 0.25-1:200000 % IJ SOLN
INTRAMUSCULAR | Status: AC
  Filled 2012-09-07: qty 30

## 2012-09-07 MED ORDER — BUPIVACAINE HCL (PF) 0.5 % IJ SOLN
INTRAMUSCULAR | Status: AC
  Filled 2012-09-07: qty 30

## 2012-09-07 MED ORDER — PROPOFOL 10 MG/ML IV BOLUS
INTRAVENOUS | Status: DC | PRN
  Administered 2012-09-07: 200 mg via INTRAVENOUS
  Administered 2012-09-07: 100 mg via INTRAVENOUS

## 2012-09-07 SURGICAL SUPPLY — 62 items
BAG ZIPLOCK 12X15 (MISCELLANEOUS) IMPLANT
BANDAGE ELASTIC 4 VELCRO ST LF (GAUZE/BANDAGES/DRESSINGS) ×4 IMPLANT
BIT DRILL 2.5X2.75 QC CALB (BIT) ×2 IMPLANT
BIT DRILL CALIBRATED 2.7 (BIT) ×2 IMPLANT
BNDG PLASTER X FAST 4X5 WHT LF (CAST SUPPLIES) ×2 IMPLANT
CLOTH BEACON ORANGE TIMEOUT ST (SAFETY) ×2 IMPLANT
COVER SURGICAL LIGHT HANDLE (MISCELLANEOUS) ×2 IMPLANT
DRAPE OEC MINIVIEW 54X84 (DRAPES) ×2 IMPLANT
DRAPE POUCH INSTRU U-SHP 10X18 (DRAPES) ×2 IMPLANT
DRAPE U-SHAPE 47X51 STRL (DRAPES) ×2 IMPLANT
DRSG PAD ABDOMINAL 8X10 ST (GAUZE/BANDAGES/DRESSINGS) ×2 IMPLANT
DURAPREP 26ML APPLICATOR (WOUND CARE) ×2 IMPLANT
ELECT REM PT RETURN 9FT ADLT (ELECTROSURGICAL) ×2
ELECTRODE REM PT RTRN 9FT ADLT (ELECTROSURGICAL) ×1 IMPLANT
GAUZE XEROFORM 5X9 LF (GAUZE/BANDAGES/DRESSINGS) ×2 IMPLANT
GLOVE BIO SURGEON STRL SZ7 (GLOVE) IMPLANT
GLOVE BIO SURGEON STRL SZ7.5 (GLOVE) ×2 IMPLANT
GLOVE BIOGEL PI IND STRL 6 (GLOVE) ×1 IMPLANT
GLOVE BIOGEL PI INDICATOR 6 (GLOVE) ×1
GLOVE SURG SS PI 6.5 STRL IVOR (GLOVE) ×2 IMPLANT
GOWN STRL REIN XL XLG (GOWN DISPOSABLE) ×2 IMPLANT
K-WIRE FIXATION 2.0X6 (WIRE) ×4
KIT BASIN OR (CUSTOM PROCEDURE TRAY) ×2 IMPLANT
KWIRE FIXATION 2.0X6 (WIRE) ×2 IMPLANT
MANIFOLD NEPTUNE II (INSTRUMENTS) IMPLANT
NEEDLE HYPO 22GX1.5 SAFETY (NEEDLE) ×2 IMPLANT
NS IRRIG 1000ML POUR BTL (IV SOLUTION) ×2 IMPLANT
PACK LOWER EXTREMITY WL (CUSTOM PROCEDURE TRAY) ×2 IMPLANT
PAD CAST 4YDX4 CTTN HI CHSV (CAST SUPPLIES) ×2 IMPLANT
PADDING CAST ABS 4INX4YD NS (CAST SUPPLIES) ×1
PADDING CAST ABS COTTON 4X4 ST (CAST SUPPLIES) ×1 IMPLANT
PADDING CAST COTTON 4X4 STRL (CAST SUPPLIES) ×2
PLATE OLECRANON LRG (Plate) ×2 IMPLANT
POSITIONER SURGICAL ARM (MISCELLANEOUS) ×2 IMPLANT
SCREW CORT T15 24X3.5XST LCK (Screw) ×1 IMPLANT
SCREW CORT T15 TPR 55X3.5XST (Screw) ×1 IMPLANT
SCREW CORTICAL 3.5X24MM (Screw) ×1 IMPLANT
SCREW CORTICAL 3.5X55MM (Screw) ×1 IMPLANT
SCREW LOCK CORT STAR 3.5X14 (Screw) ×2 IMPLANT
SCREW LOCK CORT STAR 3.5X16 (Screw) ×2 IMPLANT
SCREW LOCK CORT STAR 3.5X18 (Screw) ×2 IMPLANT
SCREW LOCK CORT STAR 3.5X22 (Screw) ×2 IMPLANT
SCREW LOCK CORT STAR 3.5X24 (Screw) ×2 IMPLANT
SLING ARM FOAM STRAP XLG (SOFTGOODS) ×2 IMPLANT
SPONGE GAUZE 4X4 12PLY (GAUZE/BANDAGES/DRESSINGS) ×2 IMPLANT
SPONGE LAP 18X18 X RAY DECT (DISPOSABLE) ×2 IMPLANT
SPONGE LAP 4X18 X RAY DECT (DISPOSABLE) ×2 IMPLANT
STAPLER VISISTAT (STAPLE) IMPLANT
STAPLER VISISTAT 35W (STAPLE) ×2 IMPLANT
STOCKINETTE 8 INCH (MISCELLANEOUS) ×2 IMPLANT
STRIP CLOSURE SKIN 1/2X4 (GAUZE/BANDAGES/DRESSINGS) IMPLANT
SUT FIBERWIRE #2 38 T-5 BLUE (SUTURE)
SUT VIC AB 0 CT1 27 (SUTURE) ×1
SUT VIC AB 0 CT1 27XBRD ANTBC (SUTURE) ×1 IMPLANT
SUT VIC AB 1 CT1 27 (SUTURE) ×1
SUT VIC AB 1 CT1 27XBRD ANTBC (SUTURE) ×1 IMPLANT
SUT VIC AB 2-0 CT2 27 (SUTURE) ×2 IMPLANT
SUT VIC AB 4-0 PS2 27 (SUTURE) ×2 IMPLANT
SUTURE FIBERWR #2 38 T-5 BLUE (SUTURE) IMPLANT
SYR CONTROL 10ML LL (SYRINGE) ×2 IMPLANT
TOWEL OR 17X26 10 PK STRL BLUE (TOWEL DISPOSABLE) ×4 IMPLANT
WATER STERILE IRR 1500ML POUR (IV SOLUTION) IMPLANT

## 2012-09-07 NOTE — Progress Notes (Signed)
Talked with Dr Leta Jungling, may give 1 extra dose of fentyl 50 mcg

## 2012-09-07 NOTE — Transfer of Care (Signed)
Immediate Anesthesia Transfer of Care Note  Patient: Marcus Raymond  Procedure(s) Performed: Procedure(s) (LRB) with comments: OPEN REDUCTION INTERNAL FIXATION (ORIF) ELBOW/OLECRANON FRACTURE (Left) - Revision open reduction internal fixation left elbow olecranon fracture  Patient Location: PACU  Anesthesia Type:General  Level of Consciousness: awake, alert , oriented and patient cooperative  Airway & Oxygen Therapy: Patient Spontanous Breathing and Patient connected to face mask oxygen  Post-op Assessment: Report given to PACU RN and Post -op Vital signs reviewed and stable  Post vital signs: Reviewed and stable  Complications: No apparent anesthesia complications

## 2012-09-07 NOTE — H&P (Signed)
Marcus Raymond is an 57 y.o. male.   Chief Complaint:   Known failed fixation of left elbow olecranon fracture HPI:   57 yo male who underwent ORIF of his left elbow olecranon fracture about one month ago.  Has since been traveling and had a mechanical fall.  Xrays in the office sow a loss of fixation of his left elbow olecranon process.  Past Medical History  Diagnosis Date  . History of colon polyps 2008    Tubular Adenoma, Dr Marina Goodell , due 2013    Past Surgical History  Procedure Date  . Colonoscopy w/ polypectomy 2008    Tubular Adenoma  . Orif elbow fracture 07/27/2012    Procedure: OPEN REDUCTION INTERNAL FIXATION (ORIF) ELBOW/OLECRANON FRACTURE;  Surgeon: [name], MD;  Location: [facility];  Service: Orthopedics;  Laterality: Left;  Open Reduction Internal Fixation left elbow olecranon    Family History  Problem Relation Age of Onset  . Hypertension Mother   . Skin cancer Mother     basal cell  . Colon polyps Father    Social History:  reports that he has never smoked. He has never used smokeless tobacco. He reports that he drinks alcohol. He reports that he does not use illicit drugs.  Allergies:  Allergies  Allergen Reactions  . Sulfonamide Derivatives     No prescriptions prior to admission    No results found for this or any previous visit (from the past 48 hour(s)). No results found.  Review of Systems  All other systems reviewed and are negative.    There were no vitals taken for this visit. Physical Exam  Constitutional: He is oriented to person, place, and time. He appears well-developed and well-nourished.  HENT:  Head: Normocephalic and atraumatic.  Eyes: EOM are normal. Pupils are equal, round, and reactive to light.  Neck: Normal range of motion. Neck supple.  Cardiovascular: Normal rate and regular rhythm.   Respiratory: Effort normal and breath sounds normal.  GI: Soft. Bowel sounds are normal.  Musculoskeletal:       Left elbow:  He exhibits decreased range of motion, swelling and deformity. tenderness found. Olecranon process tenderness noted.  Neurological: He is alert and oriented to person, place, and time.  Skin: Skin is warm and dry.  Psychiatric: He has a normal mood and affect.     Assessment/Plan Failed fixation of left elbow olecranon process 1)  To the OR today for revision fixation of left elbow olecranon process fracture  Michiah Mudry Y 09/07/2012, 7:31 AM

## 2012-09-07 NOTE — Brief Op Note (Signed)
09/07/2012  7:21 PM  PATIENT:  Bethel Born  57 y.o. male  PRE-OPERATIVE DIAGNOSIS:  Failed fixation left olecranon fracture  POST-OPERATIVE DIAGNOSIS:  failed fixation left elbow fracture  PROCEDURE:  Procedure(s) (LRB) with comments: OPEN REDUCTION INTERNAL FIXATION (ORIF) ELBOW/OLECRANON FRACTURE (Left) - Revision open reduction internal fixation left elbow olecranon fracture  SURGEON:  Surgeon(s) and Role:    * [name], MD - Primary  PHYSICIAN ASSISTANT:   ASSISTANTS: none   ANESTHESIA:   local and general  EBL:   <50 cc  BLOOD ADMINISTERED:none  DRAINS: none   LOCAL MEDICATIONS USED:  MARCAINE     SPECIMEN:  No Specimen  DISPOSITION OF SPECIMEN:  N/A  COUNTS:  YES  TOURNIQUET:   Total Tourniquet Time Documented: Upper Arm (Left) - 72 minutes  DICTATION: .Other Dictation: Dictation Number [id number]  PLAN OF CARE: Discharge to home after PACU  PATIENT DISPOSITION:  PACU - hemodynamically stable.   Delay start of Pharmacological VTE agent (>24hrs) due to surgical blood loss or risk of bleeding: not applicable

## 2012-09-07 NOTE — Preoperative (Signed)
Beta Blockers   Reason not to administer Beta Blockers:Not Applicable 

## 2012-09-07 NOTE — Addendum Note (Signed)
Addendum  created 09/07/12 2110 by Gaetano Hawthorne, MD   Modules edited:Orders

## 2012-09-07 NOTE — Anesthesia Preprocedure Evaluation (Addendum)
Anesthesia Evaluation  Patient identified by MRN, date of birth, ID band Patient awake    Reviewed: Allergy & Precautions, H&P , NPO status , Patient's Chart, lab work & pertinent test results  Airway Mallampati: II TM Distance: >3 FB Neck ROM: full    Dental No notable dental hx. (+) Teeth Intact and Dental Advisory Given,    Pulmonary neg pulmonary ROS,  breath sounds clear to auscultation  Pulmonary exam normal       Cardiovascular Exercise Tolerance: Good negative cardio ROS  Rhythm:regular Rate:Normal     Neuro/Psych negative neurological ROS  negative psych ROS   GI/Hepatic negative GI ROS, Neg liver ROS,   Endo/Other  negative endocrine ROS  Renal/GU negative Renal ROS  negative genitourinary   Musculoskeletal   Abdominal   Peds  Hematology negative hematology ROS (+)   Anesthesia Other Findings   Reproductive/Obstetrics negative OB ROS                          Anesthesia Physical Anesthesia Plan  ASA: I  Anesthesia Plan: General   Post-op Pain Management:    Induction: Intravenous  Airway Management Planned: LMA  Additional Equipment:   Intra-op Plan:   Post-operative Plan:   Informed Consent: I have reviewed the patients History and Physical, chart, labs and discussed the procedure including the risks, benefits and alternatives for the proposed anesthesia with the patient or authorized representative who has indicated his/her understanding and acceptance.   Dental Advisory Given  Plan Discussed with: CRNA and Surgeon  Anesthesia Plan Comments:         Anesthesia Quick Evaluation

## 2012-09-07 NOTE — Anesthesia Postprocedure Evaluation (Signed)
  Anesthesia Post-op Note  Patient: Marcus Raymond  Procedure(s) Performed: Procedure(s) (LRB): OPEN REDUCTION INTERNAL FIXATION (ORIF) ELBOW/OLECRANON FRACTURE (Left)  Patient Location: PACU  Anesthesia Type: General  Level of Consciousness: awake and alert   Airway and Oxygen Therapy: Patient Spontanous Breathing  Post-op Pain: mild  Post-op Assessment: Post-op Vital signs reviewed, Patient's Cardiovascular Status Stable, Respiratory Function Stable, Patent Airway and No signs of Nausea or vomiting  Last Vitals:  Filed Vitals:   09/07/12 2000  BP:   Pulse: 74  Temp:   Resp: 15    Post-op Vital Signs: stable   Complications: No apparent anesthesia complications

## 2012-09-08 NOTE — Op Note (Signed)
[name], [name]:  [id number]  MEDICAL RECORD NO.:  [id number]  LOCATION:  [facility]                         FACILITY:  [facility]  PHYSICIAN:  Vanita Panda. Magnus Ivan, M.D.DATE OF BIRTH:  09-20-1955  DATE OF PROCEDURE:  09/07/2012 DATE OF DISCHARGE:  09/07/2012                              OPERATIVE REPORT   PREOPERATIVE DIAGNOSIS:  Failed fixation left status post open reduction and internal fixation of left olecranon fracture.  POSTOPERATIVE DIAGNOSIS:  Failed fixation left status post open reduction and internal fixation of left olecranon fracture.  PROCEDURE:  Revision, fixation/open reduction and internal fixation of left elbow olecranon fracture.  SURGEON:  Vanita Panda. Magnus Ivan, M.D.  ANESTHESIA: 1. General. 2. Local with 0.25% plain Sensorcaine.  TOURNIQUET TIME:  Just over 1 hour.  BLOOD LOSS:  Less than 100 mL.  COMPLICATIONS:  None.  INDICATIONS:  Mr. Marcus Raymond is a 57 year old gentleman, who in late November sustained an olecranon fracture of his left elbow, this is quite a distal fracture.  I was able to place a small olecranon plate and screws to hold this in place.  I had seen him in followup in the office and he was doing well.  He then traveled to [place] and had a mechanical fall.  He followed up in the office this week and I saw displacement of his olecranon fracture piece.  I recommended he undergo revision, open reduction and internal fixation, and he understood this and did wish to proceed with surgery.  PROCEDURE DESCRIPTION:  After informed consent was obtained, appropriate left elbow was marked.  He was brought to the operating room, placed supine on the operative table.  General anesthesia was then obtained.  A nonsterile tourniquet was placed around his upper left arm and his left arm was prepped and draped from the upper arm down the wrist with DuraPrep, sterile drapes including a sterile stockinette.  A time-out was  called and he was identified as the correct patient, correct left elbow.  I then used Esmarch to wrap up the arm and tourniquet was inflated to 250 mm of pressure.  I then opened the incision from his previous surgery and removed the previous olecranon plate.  I then was able to mobilize the fracture and bring it back to reduced position.  I then chose a larger olecranon plate with more fixation distally and proximally and was able to fashion this along the posterior elbow and olecranon to hold the fracture in place.  This was all done under direct visualization and direct fluoroscopy.  I then irrigated the tissues copiously with normal saline.  I closed the deep tissue with 0 Vicryl followed by 2-0 Vicryl in the subcutaneous tissue and staples on the skin.  Xeroform and well-padded sterile dressing and a plaster splint were applied.  He was awakened, extubated, and taken to the recovery room in stable condition.  Of note, the tourniquet was let down and fingers pinked nicely.  Postoperatively, we will keep him in splint for the next 2 weeks to allow for healing and then gradually slowly get his elbow moving.     Vanita Panda. Magnus Ivan, M.D.     CYB/MEDQ  D:  09/07/2012  T:  09/08/2012  Job:  [id number]

## 2012-09-10 ENCOUNTER — Encounter (HOSPITAL_COMMUNITY): Payer: Self-pay | Admitting: Orthopaedic Surgery

## 2012-12-11 ENCOUNTER — Encounter: Payer: Self-pay | Admitting: Internal Medicine

## 2013-02-18 ENCOUNTER — Encounter: Payer: Self-pay | Admitting: Internal Medicine

## 2013-04-23 ENCOUNTER — Encounter: Payer: Self-pay | Admitting: Internal Medicine

## 2013-04-23 ENCOUNTER — Ambulatory Visit (AMBULATORY_SURGERY_CENTER): Payer: BC Managed Care – PPO | Admitting: *Deleted

## 2013-04-23 VITALS — Ht 67.0 in | Wt 133.4 lb

## 2013-04-23 DIAGNOSIS — Z86010 Personal history of colonic polyps: Secondary | ICD-10-CM

## 2013-04-23 MED ORDER — MOVIPREP 100 G PO SOLR: ORAL | Status: DC

## 2013-04-23 NOTE — Progress Notes (Signed)
No allergies to eggs or soy. No problems with anesthesia.  

## 2013-04-30 ENCOUNTER — Encounter: Payer: Self-pay | Admitting: Internal Medicine

## 2013-04-30 ENCOUNTER — Ambulatory Visit (AMBULATORY_SURGERY_CENTER): Payer: BC Managed Care – PPO | Admitting: Internal Medicine

## 2013-04-30 VITALS — BP 124/74 | HR 50 | Temp 96.2°F | Resp 27 | Ht 67.0 in | Wt 130.0 lb

## 2013-04-30 DIAGNOSIS — Z86010 Personal history of colonic polyps: Secondary | ICD-10-CM

## 2013-04-30 MED ORDER — SODIUM CHLORIDE 0.9 % IV SOLN: 500.0000 mL | INTRAVENOUS | Status: DC

## 2013-04-30 NOTE — Patient Instructions (Addendum)
Discharge instructions given with verbal understanding. Normal exam. Resume previous medications. YOU HAD AN ENDOSCOPIC PROCEDURE TODAY AT THE [facility]: Refer to the procedure report that was given to you for any specific questions about what was found during the examination.  If the procedure report does not answer your questions, please call your gastroenterologist to clarify.  If you requested that your care partner not be given the details of your procedure findings, then the procedure report has been included in a sealed envelope for you to review at your convenience later.  YOU SHOULD EXPECT: Some feelings of bloating in the abdomen. Passage of more gas than usual.  Walking can help get rid of the air that was put into your GI tract during the procedure and reduce the bloating. If you had a lower endoscopy (such as a colonoscopy or flexible sigmoidoscopy) you may notice spotting of blood in your stool or on the toilet paper. If you underwent a bowel prep for your procedure, then you may not have a normal bowel movement for a few days.  DIET: Your first meal following the procedure should be a light meal and then it is ok to progress to your normal diet.  A half-sandwich or bowl of soup is an example of a good first meal.  Heavy or fried foods are harder to digest and may make you feel nauseous or bloated.  Likewise meals heavy in dairy and vegetables can cause extra gas to form and this can also increase the bloating.  Drink plenty of fluids but you should avoid alcoholic beverages for 24 hours.  ACTIVITY: Your care partner should take you home directly after the procedure.  You should plan to take it easy, moving slowly for the rest of the day.  You can resume normal activity the day after the procedure however you should NOT DRIVE or use heavy machinery for 24 hours (because of the sedation medicines used during the test).    SYMPTOMS TO REPORT IMMEDIATELY: A gastroenterologist  can be reached at any hour.  During normal business hours, 8:30 AM to 5:00 PM Monday through Friday, call [phone].  After hours and on weekends, please call the GI answering service at [phone] who will take a message and have the physician on call contact you.   Following lower endoscopy (colonoscopy or flexible sigmoidoscopy):  Excessive amounts of blood in the stool  Significant tenderness or worsening of abdominal pains  Swelling of the abdomen that is new, acute  Fever of 100F or higher  FOLLOW UP: If any biopsies were taken you will be contacted by phone or by letter within the next 1-3 weeks.  Call your gastroenterologist if you have not heard about the biopsies in 3 weeks.  Our staff will call the home number listed on your records the next business day following your procedure to check on you and address any questions or concerns that you may have at that time regarding the information given to you following your procedure. This is a courtesy call and so if there is no answer at the home number and we have not heard from you through the emergency physician on call, we will assume that you have returned to your regular daily activities without incident.  SIGNATURES/CONFIDENTIALITY: You and/or your care partner have signed paperwork which will be entered into your electronic medical record.  These signatures attest to the fact that that the information above on your After Visit Summary has been reviewed   and is understood.  Full responsibility of the confidentiality of this discharge information lies with you and/or your care-partner. 

## 2013-04-30 NOTE — Progress Notes (Signed)
Patient did not experience any of the following events: a burn prior to discharge; a fall within the facility; wrong site/side/patient/procedure/implant event; or a hospital transfer or hospital admission upon discharge from the facility. (G8907) Patient did not have preoperative order for IV antibiotic SSI prophylaxis. (G8918)  

## 2013-04-30 NOTE — Op Note (Signed)
[facility] [address] [place], [address]   COLONOSCOPY PROCEDURE REPORT  PATIENT: Marcus Raymond, Marcus Raymond  MR#: [id number] BIRTHDATE: [birth year] , 56  yrs. old GENDER: Male ENDOSCOPIST: Roxy Cedar, MD REFERRED XB:[id number] Program Recall PROCEDURE DATE:  04/30/2013 PROCEDURE:   Colonoscopy, surveillance First Screening Colonoscopy - Avg.  risk and is 50 yrs.  old or older - No.  Prior Negative Screening - Now for repeat screening. N/A  History of Adenoma - Now for follow-up colonoscopy & has been > or = to 3 yrs.  Yes hx of adenoma.  Has been 3 or more years since last colonoscopy.  Polyps Removed Today? No.  Recommend repeat exam, <10 yrs? No. ASA CLASS:   Class II INDICATIONS:Patient's personal history of adenomatous colon polyps. Index 2008 w/ 2 small (<1mm) adenomas MEDICATIONS: MAC sedation, administered by CRNA and propofol (Diprivan) 350mg  IV  DESCRIPTION OF PROCEDURE:   After the risks benefits and alternatives of the procedure were thoroughly explained, informed consent was obtained.  A digital rectal exam revealed no abnormalities of the rectum.   The LB [id number]  endoscope was introduced through the anus and advanced to the cecum, which was identified by both the appendix and ileocecal valve. No adverse events experienced.   The quality of the prep was adequate, using MoviPrep  The instrument was then slowly withdrawn as the colon was fully examined.      COLON FINDINGS: A normal appearing cecum, ileocecal valve, and appendiceal orifice were identified.  The ascending, hepatic flexure, transverse, splenic flexure, descending, sigmoid colon and rectum appeared unremarkable.  No polyps or cancers were seen. Retroflexed views revealed no abnormalities. The time to cecum=3 minutes 08 seconds.  Withdrawal time=9 minutes 48 seconds.  The scope was withdrawn and the procedure completed.  COMPLICATIONS: There were no  complications.  ENDOSCOPIC IMPRESSION: 1. Normal colon  RECOMMENDATIONS: 1. Continue current colorectal screening/surveillance recommendations with a repeat colonoscopy in 10 years.   eSigned:  Roxy Cedar, MD 04/30/2013 11:59 AM   cc: Pecola Lawless, MD and The Patient

## 2013-05-01 ENCOUNTER — Telehealth: Payer: Self-pay | Admitting: *Deleted

## 2013-05-01 NOTE — Telephone Encounter (Signed)
Number identifier, left message, follow-up  

## 2016-09-05 HISTORY — PX: OTHER SURGICAL HISTORY: SHX169

## 2017-02-13 ENCOUNTER — Telehealth: Payer: Self-pay | Admitting: *Deleted

## 2017-02-14 NOTE — Telephone Encounter (Signed)
Entered in error

## 2017-03-06 ENCOUNTER — Ambulatory Visit (INDEPENDENT_AMBULATORY_CARE_PROVIDER_SITE_OTHER): Payer: BC Managed Care – PPO

## 2017-03-06 ENCOUNTER — Ambulatory Visit (INDEPENDENT_AMBULATORY_CARE_PROVIDER_SITE_OTHER): Payer: BC Managed Care – PPO | Admitting: Physician Assistant

## 2017-03-06 DIAGNOSIS — S63501A Unspecified sprain of right wrist, initial encounter: Secondary | ICD-10-CM

## 2017-03-06 NOTE — Progress Notes (Signed)
Office Visit Note   Patient: Marcus Raymond           Date of Birth: 1956-06-25           MRN: [id number] Visit Date: 03/06/2017              Requested by: Pecola Lawless, MD [address] [place], [place] [address] PCP: Dartha Lodge, MD   Assessment & Plan: Visit Diagnoses:  1. Sprain of right wrist, initial encounter     Plan: Activities as tolerated. Discussed with him that this may take up to 3 months to completely resolve. He'll follow up with [place] if pain becomes worse or does not resolve.  Follow-Up Instructions: Return if symptoms worsen or fail to improve.   Orders:  Orders Placed This Encounter  Procedures  . XR Wrist Complete Right   No orders of the defined types were placed in this encounter.     Procedures: No procedures performed   Clinical Data: No additional findings.   Subjective: Chief Complaint  Patient presents with  . Right Wrist - Injury    HPI Marcus Raymond is well-known Dr. Magnus Ivan services comes in today for right wrist injury. He states he fell on the wrist 10-12 days ago on an outstretched arm while running. He had some swelling and bruising initially and pain down the lateral aspect of the hand and wrist. He is overall trending towards improvement. He is seen no one in the past for this. He had no other injury at the time of the fall. No loss consciousness dizziness or lightheadedness.  Review of Systems Please see history of present illness otherwise negative  Objective: Vital Signs: There were no vitals taken for this visit.  Physical Exam  Constitutional: He is oriented to person, place, and time. He appears well-developed and well-nourished. No distress.  Pulmonary/Chest: Effort normal.  Neurological: He is alert and oriented to person, place, and time.  Skin: Skin is warm and dry.    Ortho Exam Right wrist radial pulse intact. Sensation grossly intact. Had no significant edema, ecchymosis or skin rashes right hand. He has  tenderness over the lateral aspect of the wrist and the volar styloid region. Extremes of radial ulnar deviation causes some discomfort. Nontender over the distal radius snuff box. Nontender metacarpals. Specialty Comments:  No specialty comments available.  Imaging: Xr Wrist Complete Right  Result Date: 03/06/2017 3 views right wrist: No acute fracture distal radius ulna and carpal bones. No malalignment bones. wrist joints well maintained. No bony abnormalities otherwise.    PMFS History: Patient Active Problem List   Diagnosis Date Noted  . Closed olecranon fracture 09/07/2012  . Fracture of left olecranon process 07/27/2012  . Other abnormal glucose 12/22/2010  . COLONIC POLYPS, HX OF 12/16/2008   Past Medical History:  Diagnosis Date  . History of colon polyps 2008   Tubular Adenoma, Dr Marina Goodell , due 2013    Family History  Problem Relation Age of Onset  . Hypertension Mother   . Skin cancer Mother        basal cell  . Colon polyps Father 68    Past Surgical History:  Procedure Laterality Date  . COLONOSCOPY W/ POLYPECTOMY  2008   Tubular Adenoma  . ORIF ELBOW FRACTURE  07/27/2012   Procedure: OPEN REDUCTION INTERNAL FIXATION (ORIF) ELBOW/OLECRANON FRACTURE;  Surgeon: [name], MD;  Location: [facility];  Service: Orthopedics;  Laterality: Left;  Open Reduction Internal Fixation left elbow olecranon  .  ORIF ELBOW FRACTURE  09/07/2012   Procedure: OPEN REDUCTION INTERNAL FIXATION (ORIF) ELBOW/OLECRANON FRACTURE;  Surgeon: Kathryne Hitchhristopher Y Blackman, MD;  Location: [facility];  Service: Orthopedics;  Laterality: Left;  Revision open reduction internal fixation left elbow olecranon fracture   Social History   Occupational History  . Not on file.   Social History Main Topics  . Smoking status: Never Smoker  . Smokeless tobacco: Never Used  . Alcohol use 0.0 oz/week     Comment: daily wine or beer per pt   . Drug use: No  . Sexual activity: Not on file

## 2018-05-07 ENCOUNTER — Emergency Department (HOSPITAL_COMMUNITY)
Admission: EM | Admit: 2018-05-07 | Discharge: 2018-05-07 | Disposition: A | Discharge status: Home / Self Care | Payer: BC Managed Care – PPO | Attending: Emergency Medicine | Admitting: Emergency Medicine

## 2018-05-07 ENCOUNTER — Emergency Department (HOSPITAL_COMMUNITY): Payer: BC Managed Care – PPO

## 2018-05-07 ENCOUNTER — Other Ambulatory Visit: Payer: Self-pay

## 2018-05-07 DIAGNOSIS — Y9373 Activity, racquet and hand sports: Secondary | ICD-10-CM | POA: Insufficient documentation

## 2018-05-07 DIAGNOSIS — Y929 Unspecified place or not applicable: Secondary | ICD-10-CM | POA: Diagnosis not present

## 2018-05-07 DIAGNOSIS — S43102A Unspecified dislocation of left acromioclavicular joint, initial encounter: Secondary | ICD-10-CM | POA: Diagnosis not present

## 2018-05-07 DIAGNOSIS — Y999 Unspecified external cause status: Secondary | ICD-10-CM | POA: Diagnosis not present

## 2018-05-07 DIAGNOSIS — S4992XA Unspecified injury of left shoulder and upper arm, initial encounter: Secondary | ICD-10-CM | POA: Diagnosis present

## 2018-05-07 DIAGNOSIS — W010XXA Fall on same level from slipping, tripping and stumbling without subsequent striking against object, initial encounter: Secondary | ICD-10-CM | POA: Diagnosis not present

## 2018-05-07 MED ORDER — IBUPROFEN 800 MG PO TABS: 800.0000 mg | ORAL_TABLET | Freq: Three times a day (TID) | ORAL | 0 refills | Status: DC

## 2018-05-07 NOTE — ED Provider Notes (Signed)
[facility]-EMERGENCY DEPT Provider Note   CSN: [id number] Arrival date & time: 05/07/18  1046     History   Chief Complaint Chief Complaint  Patient presents with  . Shoulder Injury    HPI Marcus Raymond is a 62 y.o. male.  HPI Patient was playing tennis and had a mechanical fall.  He landed on his left shoulder.  He had a lot of pain and suspected it might be dislocated.  He reports he had history of 2 prior dislocations.  He reports that the pain is easing now that is been a little longer since it occurred.  Still a lot of pain with range of motion.  No numbness or tingling in the hand.  No associated head injury or neck injury. Past Medical History:  Diagnosis Date  . 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 Closed olecranon fracture 09/07/2012  . Fracture of left olecranon process 07/27/2012  . Other abnormal glucose 12/22/2010  . COLONIC POLYPS, HX OF 12/16/2008    Past Surgical History:  Procedure Laterality Date  . COLONOSCOPY W/ POLYPECTOMY  2008   Tubular Adenoma  . ORIF ELBOW FRACTURE  07/27/2012   Procedure: OPEN REDUCTION INTERNAL FIXATION (ORIF) ELBOW/OLECRANON FRACTURE;  Surgeon: [name], MD;  Location: [facility];  Service: Orthopedics;  Laterality: Left;  Open Reduction Internal Fixation left elbow olecranon  . ORIF ELBOW FRACTURE  09/07/2012   Procedure: OPEN REDUCTION INTERNAL FIXATION (ORIF) ELBOW/OLECRANON FRACTURE;  Surgeon: [name], MD;  Location: [facility];  Service: Orthopedics;  Laterality: Left;  Revision open reduction internal fixation left elbow olecranon fracture        Home Medications    Prior to Admission medications   Medication Sig Start Date End Date Taking? Authorizing Provider  ibuprofen (ADVIL,MOTRIN) 200 MG tablet Take 200 mg by mouth every 6 (six) hours as needed.    [provider]    ibuprofen (ADVIL,MOTRIN) 800 MG tablet Take 1 tablet (800 mg total) by mouth 3 (three) times daily. 05/07/18   Arby Barrette, MD  Multiple Vitamins-Minerals (MULTIVITAMIN WITH MINERALS) tablet Take 1 tablet by mouth daily.    [provider]    Family History Family History  Problem Relation Age of Onset  . Hypertension Mother   . Skin cancer Mother        basal cell  . Colon polyps Father 81    Social History Social History   Tobacco Use  . Smoking status: Never Smoker  . Smokeless tobacco: Never Used  Substance Use Topics  . Alcohol use: Yes    Comment: daily wine or beer per pt   . Drug use: No     Allergies   Sulfonamide derivatives   Review of Systems Review of Systems Constitutional: No recent fever chills or general illness Neurologic: No headache, no confusion, no weakness numbness or tingling.  Physical Exam Updated Vital Signs BP (!) 161/98   Pulse (!) 58   Temp 98 F (36.7 C) (Oral)   Resp 15   Wt 58.1 kg   SpO2 100%   BMI 20.05 kg/m   Physical Exam  Constitutional: He is oriented to person, place, and time. He appears well-developed and well-nourished. No distress.  HENT:  Head: Normocephalic and atraumatic.  Eyes: EOM are normal.  Cardiovascular: Intact distal pulses.  Pulmonary/Chest: Effort normal.  Musculoskeletal:  Patient has pain localized over the [facility] joint on the left.  Patient does have smooth passive range of motion of the glenohumeral joint.  Distal pulse 2+.  Grip strength normal.  Neurological: He is alert and oriented to person, place, and time. No cranial nerve deficit. He exhibits normal muscle tone. Coordination normal.  Skin: Skin is warm and dry.  Psychiatric: He has a normal mood and affect.     ED Treatments / Results  Labs (all labs ordered are listed, but only abnormal results are displayed) Labs Reviewed - No data to display  EKG None  Radiology Dg Shoulder Left  Result Date: 05/07/2018 CLINICAL  DATA:  The patient suffered a fall with a injury to the left shoulder playing tennis this morning. EXAM: LEFT SHOULDER - 2+ VIEW COMPARISON:  None. FINDINGS: The humeral head is located. No fracture is identified. The distal clavicle is mildly elevated relative to the acromion suggestive of grade 3 acromioclavicular joint separation. Imaged left lung and ribs appear normal. IMPRESSION: Mild elevation of the clavicular head relative to the acromion suggestive of grade 3 AC joint separation. The exam is otherwise negative. Electronically Signed   By: Drusilla Kanner M.D.   On: 05/07/2018 11:31    Procedures Procedures (including critical care time)  Medications Ordered in ED Medications - No data to display   Initial Impression / Assessment and Plan / ED Course  I have reviewed the triage vital signs and the nursing notes.  Pertinent labs & imaging results that were available during my care of the patient were reviewed by me and considered in my medical decision making (see chart for details).     X-ray confirms AC separation.  No evidence of dislocation.  No evidence of neurovascular injury.  No associated head or neck injury.  Patient is seen Dr. Magnus Ivan in the past.  He will follow-up. he is placed in a sling with instructions for icing and ibuprofen.  Final Clinical Impressions(s) / ED Diagnoses   Final diagnoses:  AC separation, left, initial encounter    ED Discharge Orders         Ordered    ibuprofen (ADVIL,MOTRIN) 800 MG tablet  3 times daily     05/07/18 1223           Arby Barrette, MD 05/07/18 1232

## 2018-05-07 NOTE — ED Triage Notes (Signed)
Pt arrives from home. Pt reports that he was playing tennis this AM when he fell falling on left shoulder. Pt reports that he has dislocated his left shoulder 2 other times and this feels the same.

## 2018-05-07 NOTE — ED Notes (Signed)
Discharge instructions reviewed with patient. Patient verbalizes understanding. VSS.   

## 2018-05-08 ENCOUNTER — Ambulatory Visit (INDEPENDENT_AMBULATORY_CARE_PROVIDER_SITE_OTHER): Payer: BC Managed Care – PPO | Admitting: Orthopaedic Surgery

## 2018-05-08 ENCOUNTER — Encounter (INDEPENDENT_AMBULATORY_CARE_PROVIDER_SITE_OTHER): Payer: Self-pay | Admitting: Orthopaedic Surgery

## 2018-05-08 DIAGNOSIS — S43102A Unspecified dislocation of left acromioclavicular joint, initial encounter: Secondary | ICD-10-CM | POA: Diagnosis not present

## 2018-05-08 NOTE — Progress Notes (Signed)
Office Visit Note   Patient: Marcus Raymond           Date of Birth: 07/06/1956           MRN: [id number] Visit Date: 05/08/2018              Requested by: Milus Banister, MD [address] [place], [place] [address] PCP: Milus Banister, MD   Assessment & Plan: Visit Diagnoses:  1. Separation of left acromioclavicular joint, initial encounter     Plan: He will continue the sling for the next 2 weeks when sleeping and when out of the home.  Discussed with him the need for him to refrain from overhead activities.  He will work on supination pronation forearm range of motion the wrist and hand.  He also work on gentle range of motion elbow shoulder.  See him back in 2 weeks for reevaluation.  He understands that he will need to refrain from overhead activity for at least 6 to 8 weeks.  No radiographs at return.   Follow-Up Instructions: Return in about 2 weeks (around 05/22/2018).   Orders:  No orders of the defined types were placed in this encounter.  No orders of the defined types were placed in this encounter.     Procedures: No procedures performed   Clinical Data: No additional findings.   Subjective: Chief Complaint  Patient presents with  . Left Shoulder - Pain, Follow-up    HPI Marcus Raymond is a 62 year old male well-known to Dr. Magnus Ivan service.  He comes in today after sustaining an [facility] joint separation of his left shoulder while playing tennis.  He reports that he was going for a shot may have tripped over his own feet coming down on his left shoulder.  Had no other injury.  He was seen in the ER and found to have a grade 3 AC joint separation.  He was placed in the sling appropriately.  Is been taking ibuprofen for the pain.  No loss consciousness dizziness or head injury. Radiographs from the ER dated 05/07/2018 reviewed by myself and Dr. Magnus Ivan and show a grade 3 AC joint separation.  Humeral heads well located.  No other acute fractures  identified. Review of Systems Please see HPI otherwise negative  Objective: Vital Signs: There were no vitals taken for this visit.  Physical Exam General: Well-developed well-nourished male in no acute distress. Respirations: Unlabored Neurologic: Bilateral hands full sensation to light touch motor bilateral hands.  Ortho Exam Left shoulder: Global swelling.  No tenting skin. Specialty Comments:  No specialty comments available.  Imaging: Dg Shoulder Left  Result Date: 05/07/2018 CLINICAL DATA:  The patient suffered a fall with a injury to the left shoulder playing tennis this morning. EXAM: LEFT SHOULDER - 2+ VIEW COMPARISON:  None. FINDINGS: The humeral head is located. No fracture is identified. The distal clavicle is mildly elevated relative to the acromion suggestive of grade 3 acromioclavicular joint separation. Imaged left lung and ribs appear normal. IMPRESSION: Mild elevation of the clavicular head relative to the acromion suggestive of grade 3 AC joint separation. The exam is otherwise negative. Electronically Signed   By: Drusilla Kanner M.D.   On: 05/07/2018 11:31     PMFS History: Patient Active Problem List   Diagnosis Date Noted  . Closed olecranon fracture 09/07/2012  . Fracture of left olecranon process 07/27/2012  . Other abnormal glucose 12/22/2010  . COLONIC POLYPS, HX OF 12/16/2008   Past Medical History:  Diagnosis Date  . History of colon polyps 2008   Tubular Adenoma, Dr Marina Goodell , due 2013    Family History  Problem Relation Age of Onset  . Hypertension Mother   . Skin cancer Mother        basal cell  . Colon polyps Father 62    Past Surgical History:  Procedure Laterality Date  . COLONOSCOPY W/ POLYPECTOMY  2008   Tubular Adenoma  . ORIF ELBOW FRACTURE  07/27/2012   Procedure: OPEN REDUCTION INTERNAL FIXATION (ORIF) ELBOW/OLECRANON FRACTURE;  Surgeon: [name], MD;  Location: [facility];  Service: Orthopedics;  Laterality: Left;  Open  Reduction Internal Fixation left elbow olecranon  . ORIF ELBOW FRACTURE  09/07/2012   Procedure: OPEN REDUCTION INTERNAL FIXATION (ORIF) ELBOW/OLECRANON FRACTURE;  Surgeon: [name], MD;  Location: [facility];  Service: Orthopedics;  Laterality: Left;  Revision open reduction internal fixation left elbow olecranon fracture   Social History   Occupational History  . Not on file  Tobacco Use  . Smoking status: Never Smoker  . Smokeless tobacco: Never Used  Substance and Sexual Activity  . Alcohol use: Yes    Comment: daily wine or beer per pt   . Drug use: No  . Sexual activity: Not on file

## 2018-05-22 ENCOUNTER — Encounter (INDEPENDENT_AMBULATORY_CARE_PROVIDER_SITE_OTHER): Payer: Self-pay | Admitting: Orthopaedic Surgery

## 2018-05-22 ENCOUNTER — Telehealth (INDEPENDENT_AMBULATORY_CARE_PROVIDER_SITE_OTHER): Payer: Self-pay | Admitting: Orthopaedic Surgery

## 2018-05-22 ENCOUNTER — Ambulatory Visit (INDEPENDENT_AMBULATORY_CARE_PROVIDER_SITE_OTHER): Payer: BC Managed Care – PPO | Admitting: Orthopaedic Surgery

## 2018-05-22 DIAGNOSIS — S43102D Unspecified dislocation of left acromioclavicular joint, subsequent encounter: Secondary | ICD-10-CM | POA: Diagnosis not present

## 2018-05-22 DIAGNOSIS — S43102A Unspecified dislocation of left acromioclavicular joint, initial encounter: Secondary | ICD-10-CM | POA: Insufficient documentation

## 2018-05-22 MED ORDER — DICLOFENAC SODIUM 1 % TD GEL: 2.0000 g | Freq: Four times a day (QID) | TRANSDERMAL | 3 refills | Status: DC

## 2018-05-22 NOTE — Telephone Encounter (Signed)
Do you know how this is done?

## 2018-05-22 NOTE — Telephone Encounter (Signed)
Patient wondering if x-rays from last visit, 05/07/18 left shoulder could be put on his MyChart. He is unable to see them. # [phone]

## 2018-05-22 NOTE — Telephone Encounter (Signed)
I have no idea how that is done.  i'm fine with it though

## 2018-05-22 NOTE — Progress Notes (Signed)
The patient is following up after having sustained a left shoulder injury 2 weeks ago.  This is a type II to type III AC separation.  He is been in a sling but using his shoulder when he can and letting comfort be his guide and pain be his guide.  On exam standing straight with his shoulders in same position, he cannot see a cosmetic deformity as much as he can when standing behind him located shoulder.  From a pain standpoint though his pain is minimal.  He is got excellent range of motion of the shoulder is well located.  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  Right now the shoulder is well located and not stiff.  At this point we will continue increase his activities as comfort allows but is no refrain from any bench pressing and push-ups as well as limiting his overhead activity.  I like to see him back in 4 weeks for final visit.  I would like just a single AP view of the left shoulder at that visit.

## 2018-05-23 NOTE — Telephone Encounter (Signed)
I think we can release the results to mychart, but we cannot send actual images to mychart.  We can make a cd or print paper copies if needed.

## 2018-05-23 NOTE — Telephone Encounter (Signed)
I AM NOT SURE OF HOW THIS IS DONE

## 2018-05-23 NOTE — Telephone Encounter (Signed)
Do you know how to "release" these?

## 2018-05-24 NOTE — Telephone Encounter (Signed)
noone knows how to do this? Do you know who might know? Eric?

## 2018-05-25 NOTE — Telephone Encounter (Signed)
I have spoken with the mychart team, and there is no way to send images to epic.  The report is sent to patient's mychart and has been viewed.  I have called patient to advise, and he is tied up with an urgent matter and will call me back. I will advise him and discuss this when he calls back.

## 2018-05-29 NOTE — Telephone Encounter (Signed)
No response from patient

## 2018-05-30 ENCOUNTER — Other Ambulatory Visit (INDEPENDENT_AMBULATORY_CARE_PROVIDER_SITE_OTHER): Payer: Self-pay

## 2018-05-30 ENCOUNTER — Telehealth (INDEPENDENT_AMBULATORY_CARE_PROVIDER_SITE_OTHER): Payer: Self-pay | Admitting: Orthopaedic Surgery

## 2018-05-30 MED ORDER — DICLOFENAC SODIUM 2 % TD SOLN: 1.0000 [drp] | Freq: Three times a day (TID) | TRANSDERMAL | 3 refills | Status: DC | PRN

## 2018-05-30 NOTE — Telephone Encounter (Signed)
Patient called stating that he received a letter from [facility] denying the Voltaren Gel.  He called to see if Dr. Magnus Ivan will put in a special request for the gel verses the pills.  CB#[phone].  Thank you.

## 2018-05-30 NOTE — Progress Notes (Signed)
pennsaid

## 2018-05-30 NOTE — Telephone Encounter (Signed)
LMOM  For patient I sent this into [facility]

## 2018-05-31 ENCOUNTER — Telehealth (INDEPENDENT_AMBULATORY_CARE_PROVIDER_SITE_OTHER): Payer: Self-pay | Admitting: Orthopaedic Surgery

## 2018-05-31 NOTE — Telephone Encounter (Signed)
Verbal order given to pharmacy for this recommended dose

## 2018-05-31 NOTE — Telephone Encounter (Signed)
Carrie with [facility] called to let Dr. Magnus Ivan know that the directions for pennsaid? exceed the max daily dosing, recommended is 2 pumps twice a day per body part. Please give her a call to update directions # [phone]

## 2018-06-11 ENCOUNTER — Telehealth (INDEPENDENT_AMBULATORY_CARE_PROVIDER_SITE_OTHER): Payer: Self-pay | Admitting: Orthopaedic Surgery

## 2018-06-11 NOTE — Telephone Encounter (Signed)
Patient requesting a note be emailed to him for his accident insurance, he said they just need proof of what he is being treated for by Dr. Magnus Ivan. He said it was stage 3 left shoulder separation? Please email to patient : [email or web address]

## 2018-06-12 NOTE — Telephone Encounter (Signed)
Can we email 05/08/18 and 05/22/18 office note to him?

## 2018-06-19 ENCOUNTER — Ambulatory Visit (INDEPENDENT_AMBULATORY_CARE_PROVIDER_SITE_OTHER): Payer: BC Managed Care – PPO | Admitting: Orthopaedic Surgery

## 2018-06-21 ENCOUNTER — Ambulatory Visit (INDEPENDENT_AMBULATORY_CARE_PROVIDER_SITE_OTHER): Payer: Self-pay

## 2018-06-21 ENCOUNTER — Encounter (INDEPENDENT_AMBULATORY_CARE_PROVIDER_SITE_OTHER): Payer: Self-pay | Admitting: Orthopaedic Surgery

## 2018-06-21 ENCOUNTER — Ambulatory Visit (INDEPENDENT_AMBULATORY_CARE_PROVIDER_SITE_OTHER): Payer: BC Managed Care – PPO | Admitting: Orthopaedic Surgery

## 2018-06-21 DIAGNOSIS — S43102D Unspecified dislocation of left acromioclavicular joint, subsequent encounter: Secondary | ICD-10-CM

## 2018-06-21 NOTE — Progress Notes (Signed)
The patient is about 6 weeks status post a type II to type III left shoulder AC joint separation.  He is doing much better overall.  On examination there is only some slight prominence of his AC joint and some slight muscle atrophy around the shoulder but overall he looks good.  His range of motion is full of his left shoulder is gotten better strength with only minimal pain.  A single view of the shoulder is obtained and compared to previous films and surprisingly the [facility] joint appears much more congruent on the left side than it did at the time of his injury.  This point he was slow to get back in activities as comfort allows.  He should expect pains here and there over the next few months as he continues to scar down and heal this.  All question concerns were answered and addressed.

## 2018-06-25 ENCOUNTER — Telehealth: Payer: Self-pay | Admitting: Family Medicine

## 2018-06-25 NOTE — Progress Notes (Signed)
[facility] [address] [place], [place] [address] Phone: [phone] Subjective:   Marcus Raymond, am serving as a scribe for Dr. Antoine Primas.  I'm seeing this patient by the request  of:  Milus Banister, MD   CC: Left shoulder pain  [id number]  Marcus Raymond is a 62 y.o. male coming in with complaint of left shoulder pain since Labor Day. Fell on shoulder playing tennis. Has a history of separation. Was diagnosed with a Grade III separation. Has seen Magnus Ivan but wants another opinion. Does have achy pain intermittently. No issues with sleeping. Does a lot of running and feels ok with running.        Past Medical History:  Diagnosis Date  . History of colon polyps 2008   Tubular Adenoma, Dr Marina Goodell , due 2013   Past Surgical History:  Procedure Laterality Date  . COLONOSCOPY W/ POLYPECTOMY  2008   Tubular Adenoma  . ORIF ELBOW FRACTURE  07/27/2012   Procedure: OPEN REDUCTION INTERNAL FIXATION (ORIF) ELBOW/OLECRANON FRACTURE;  Surgeon: [name], MD;  Location: [facility];  Service: Orthopedics;  Laterality: Left;  Open Reduction Internal Fixation left elbow olecranon  . ORIF ELBOW FRACTURE  09/07/2012   Procedure: OPEN REDUCTION INTERNAL FIXATION (ORIF) ELBOW/OLECRANON FRACTURE;  Surgeon: [name], MD;  Location: [facility];  Service: Orthopedics;  Laterality: Left;  Revision open reduction internal fixation left elbow olecranon fracture   Social History   Socioeconomic History  . Marital status: Married    Spouse name: Not on file  . Number of children: Not on file  . Years of education: Not on file  . Highest education level: Not on file  Occupational History  . Not on file  Social Needs  . Financial resource strain: Not on file  . Food insecurity:    Worry: Not on file    Inability: Not on file  . Transportation needs:    Medical: Not on file    Non-medical: Not on file  Tobacco Use  . Smoking status:  Never Smoker  . Smokeless tobacco: Never Used  Substance and Sexual Activity  . Alcohol use: Yes    Comment: daily wine or beer per pt   . Drug use: No  . Sexual activity: Not on file  Lifestyle  . Physical activity:    Days per week: Not on file    Minutes per session: Not on file  . Stress: Not on file  Relationships  . Social connections:    Talks on phone: Not on file    Gets together: Not on file    Attends religious service: Not on file    Active member of club or organization: Not on file    Attends meetings of clubs or organizations: Not on file    Relationship status: Not on file  Other Topics Concern  . Not on file  Social History Narrative  . Not on file   Allergies  Allergen Reactions  . Sulfonamide Derivatives     Per pt: as child   Family History  Problem Relation Age of Onset  . Hypertension Mother   . Skin cancer Mother        basal cell  . Colon polyps Father 20     Current Outpatient Medications (Cardiovascular):  .  nitroGLYCERIN (NITRODUR - DOSED IN MG/24 HR) 0.2 mg/hr patch, 1/4 patch daily   Current Outpatient Medications (Analgesics):  .  ibuprofen (ADVIL,MOTRIN) 200 MG  tablet, Take 200 mg by mouth every 6 (six) hours as needed. Marland Kitchen  ibuprofen (ADVIL,MOTRIN) 800 MG tablet, Take 1 tablet (800 mg total) by mouth 3 (three) times daily.   Current Outpatient Medications (Other):  Marland Kitchen  Diclofenac Sodium 2 % SOLN, Place 1-2 drops onto the skin 3 (three) times daily as needed. .  Multiple Vitamins-Minerals (MULTIVITAMIN WITH MINERALS) tablet, Take 1 tablet by mouth daily. .  Vitamin D, Ergocalciferol, (DRISDOL) 50000 units CAPS capsule, Take 1 capsule (50,000 Units total) by mouth every 7 (seven) days.    Past medical history, social, surgical and family history all reviewed in electronic medical record.  No pertanent information unless stated regarding to the chief complaint.   Review of Systems:  No headache, visual changes, nausea, vomiting,  diarrhea, constipation, dizziness, abdominal pain, skin rash, fevers, chills, night sweats, weight loss, swollen lymph nodes, body aches, joint swelling,  chest pain, shortness of breath, mood changes.  Positive muscle aches  Objective  Blood pressure 118/82, pulse 70, height 5\' 7"  (1.702 m), weight 130 lb (59 kg), SpO2 98 %.    General: No apparent distress alert and oriented x3 mood and affect normal, dressed appropriately.  HEENT: Pupils equal, extraocular movements intact  Respiratory: Patient's speak in full sentences and does not appear short of breath  Cardiovascular: No lower extremity edema, non tender, no erythema  Skin: Warm dry intact with no signs of infection or rash on extremities or on axial skeleton.  Abdomen: Soft nontender  Neuro: Cranial nerves II through XII are intact, neurovascularly intact in all extremities with 2+ DTRs and 2+ pulses.  Lymph: No lymphadenopathy of posterior or anterior cervical chain or axillae bilaterally.  Gait normal  MSK:  Non tender with full range of motion and good stability and symmetric strength and tone of  elbows, wrist, hip, knee and ankles bilaterally.  Shoulder: Left Inspection reveals no abnormalities, atrophy or asymmetry. Palpation  AC joint shows abnormality and some swelling ROM is full in all planes. Rotator cuff strength 4+ out of 5 Speeds and Yergason's tests normal. Positive O'Brien. Normal scapular function observed. No painful arc and no drop arm sign. No apprehension sign Contralateral shoulder unremarkable    MSK US performed of: left shoulder  This study was ordered, performed, and interpreted by Terrilee Files D.O.  Shoulder:   Supraspinatus:  Appears normal on long and transverse views, no bursal bulge seen with shoulder abduction on impingement view. Subscapularis:  Appears normal on long and transverse views. Teres Minor:  Appears normal on long and transverse views. AC joint: Significant abnormality noted.   Patient does have a what seems to be a hemarthrosis. Glenohumeral Joint:  Appears normal without effusion. Glenoid Labrum:  Intact without visualized tears. Biceps Tendon:  Appears normal on long and transverse views, no fraying of tendon, tendon located in intertubercular groove, no subluxation with shoulder internal or external rotation. No increased power doppler signal. Impression: AC joint arthrosis  97110; 15 additional minutes spent for Therapeutic exercises as stated in above notes.  This included exercises focusing on stretching, strengthening, with significant focus on eccentric aspects.   Long term goals include an improvement in range of motion, strength, endurance as well as avoiding reinjury. Patient's frequency would include in 1-2 times a day, 3-5 times a week for a duration of 6-12 weeks. Shoulder Exercises that included:  Basic scapular stabilization to include adduction and depression of scapula Scaption, focusing on proper movement and good control Internal and External rotation  utilizing a theraband, with elbow tucked at side entire time Rows with theraband    Proper technique shown and discussed handout in great detail with ATC.  All questions were discussed and answered.       Impression and Recommendations:     This case required medical decision making of moderate complexity. The above documentation has been reviewed and is accurate and complete Judi Saa, DO       Note: This dictation was prepared with Dragon dictation along with smaller phrase technology. Any transcriptional errors that result from this process are unintentional.

## 2018-06-25 NOTE — Telephone Encounter (Signed)
Noted  

## 2018-06-25 NOTE — Telephone Encounter (Signed)
Copied from CRM [id number]. Topic: General - Other >> Jun 25, 2018 12:24 PM Trula Slade wrote: Reason for CRM:  Patient went to [facility] on 06/21/18 and he would like for Dr. Katrinka Blazing to pull his records before his appt tomorrow.

## 2018-06-26 ENCOUNTER — Ambulatory Visit (INDEPENDENT_AMBULATORY_CARE_PROVIDER_SITE_OTHER): Payer: BC Managed Care – PPO | Admitting: Family Medicine

## 2018-06-26 ENCOUNTER — Telehealth: Payer: Self-pay

## 2018-06-26 ENCOUNTER — Ambulatory Visit: Payer: Self-pay

## 2018-06-26 ENCOUNTER — Encounter: Payer: Self-pay | Admitting: Family Medicine

## 2018-06-26 VITALS — BP 118/82 | HR 70 | Ht 67.0 in | Wt 130.0 lb

## 2018-06-26 DIAGNOSIS — M25012 Hemarthrosis, left shoulder: Secondary | ICD-10-CM | POA: Insufficient documentation

## 2018-06-26 DIAGNOSIS — M25512 Pain in left shoulder: Principal | ICD-10-CM

## 2018-06-26 DIAGNOSIS — G8929 Other chronic pain: Secondary | ICD-10-CM

## 2018-06-26 MED ORDER — NITROGLYCERIN 0.2 MG/HR TD PT24: MEDICATED_PATCH | TRANSDERMAL | 1 refills | Status: DC

## 2018-06-26 MED ORDER — VITAMIN D (ERGOCALCIFEROL) 1.25 MG (50000 UNIT) PO CAPS: 50000.0000 [IU] | ORAL_CAPSULE | ORAL | 0 refills | Status: DC

## 2018-06-26 NOTE — Telephone Encounter (Signed)
Left message for patient to call back to address questions he has.

## 2018-06-26 NOTE — Patient Instructions (Addendum)
Good to see you.  Ice 20 minutes 2 times daily. Usually after activity and before bed. Exercises 3 times a week.  Keep hands within peripheral vison  Once weekly vitamin D for 12 weeks Nitroglycerin Protocol   Apply 1/4 nitroglycerin patch to affected area daily.  Change position of patch within the affected area every 24 hours.  You may experience a headache during the first 1-2 weeks of using the patch, these should subside.  If you experience headaches after beginning nitroglycerin patch treatment, you may take your preferred over the counter pain reliever.  Another side effect of the nitroglycerin patch is skin irritation or rash related to patch adhesive.  Please notify our office if you develop more severe headaches or rash, and stop the patch.  Tendon healing with nitroglycerin patch may require 12 to 24 weeks depending on the extent of injury.  Men should not use if taking Viagra, Cialis, or Levitra.   Do not use if you have migraines or rosacea. See me again in 4 weeks and see how you are doing

## 2018-06-26 NOTE — Telephone Encounter (Signed)
Copied from CRM [id number]. Topic: General - Inquiry >> Jun 26, 2018  3:08 PM Windy Kalata, NT wrote: Reason for CRM: patient is calling and was seen by Dr. Katrinka Blazing today 06/26/18 and is requesting Vikki Ports Dr. Katrinka Blazing nurse to give him a call.

## 2018-06-26 NOTE — Assessment & Plan Note (Signed)
Hemarthrosis of the left shoulder noted.  Patient does have instability with ligamentous injuries.  Increasing Doppler flow.  Started nitroglycerin in 1 to potential side effects.  Started once weekly vitamin D encourage patient continue the topical anti-inflammatories.  Discussed symptoms consider injection and formal physical therapy and follow-up in 4 to 6 weeks

## 2018-06-27 NOTE — Telephone Encounter (Signed)
Spoke with patient in regards to no recommendation from Dr. Katrinka Blazing for a replacement for nitro patches since he cannot take them and how to use pennsaid (twice daily). Patient voices understanding.

## 2018-07-10 ENCOUNTER — Telehealth: Payer: Self-pay
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
## 2018-07-25 NOTE — Progress Notes (Signed)
Marcus Raymond ScaleZach  D.O.  Sports Medicine [address] [place], [place] [address] Phone: [phone] Subjective:    I Marcus Raymond NighKana Raymond am serving as a Neurosurgeonscribe for Dr. Antoine PrimasZachary .   CC: Left shoulder pain   ZHY:[id number]:Subjective  Marcus Raymond BornGerald Raymond is a 62 y.o. male coming in with complaint of left shoulder pain. Believes he is making some progress. Has been doing the exercises. Has also added weight training. Hasn't tried bench press but wants clarence on more advanced exercises. Some days the shoulder is sore. Achy pain. This past Tuesday he felt a pull in the shoulder after snaking a drain. Sore post injury. Achilles pain. Pain comes and goes. Treatment includes heel raises and calf strengthening.   Patient was found to have more of the left acromioclavicular separation with a hemarthrosis.  Patient has been doing much better than had an injury.  Also a right hand injury.  States that he does not know why.  May have hit it against something.  Has not done any true exercises for it.  Is getting better but very slowly.  No swelling, numbness,    Left elbow pain.  Patient has a history of a fracture of the olecranon process.  Surgery was 4 years ago were removed hardware.  Hit it in the shower.  Felt pain.  Continues to have an aching sensation.      Past Medical History:  Diagnosis Date  . History of colon polyps 2008   Tubular Adenoma, Dr Marina GoodellPerry , due 2013   Past Surgical History:  Procedure Laterality Date  . COLONOSCOPY W/ POLYPECTOMY  2008   Tubular Adenoma  . ORIF ELBOW FRACTURE  07/27/2012   Procedure: OPEN REDUCTION INTERNAL FIXATION (ORIF) ELBOW/OLECRANON FRACTURE;  Surgeon: Kathryne Hitchhristopher Y Blackman, MD;  Location: [facility];  Service: Orthopedics;  Laterality: Left;  Open Reduction Internal Fixation left elbow olecranon  . ORIF ELBOW FRACTURE  09/07/2012   Procedure: OPEN REDUCTION INTERNAL FIXATION (ORIF) ELBOW/OLECRANON FRACTURE;  Surgeon: Kathryne Hitchhristopher Y Blackman, MD;  Location: [facility];   Service: Orthopedics;  Laterality: Left;  Revision open reduction internal fixation left elbow olecranon fracture   Social History   Socioeconomic History  . Marital status: Married    Spouse name: Not on file  . Number of children: Not on file  . Years of education: Not on file  . Highest education level: Not on file  Occupational History  . Not on file  Social Needs  . Financial resource strain: Not on file  . Food insecurity:    Worry: Not on file    Inability: Not on file  . Transportation needs:    Medical: Not on file    Non-medical: Not on file  Tobacco Use  . Smoking status: Never Smoker  . Smokeless tobacco: Never Used  Substance and Sexual Activity  . Alcohol use: Yes    Comment: daily wine or beer per pt   . Drug use: No  . Sexual activity: Not on file  Lifestyle  . Physical activity:    Days per week: Not on file    Minutes per session: Not on file  . Stress: Not on file  Relationships  . Social connections:    Talks on phone: Not on file    Gets together: Not on file    Attends religious service: Not on file    Active member of club or organization: Not on file    Attends meetings of clubs or organizations: Not on file  Relationship status: Not on file  Other Topics Concern  . Not on file  Social History Narrative  . Not on file   Allergies  Allergen Reactions  . Sulfonamide Derivatives     Per pt: as child   Family History  Problem Relation Age of Onset  . Hypertension Mother   . Skin cancer Mother        basal cell  . Colon polyps Father 84     Current Outpatient Medications (Cardiovascular):  .  nitroGLYCERIN (NITRODUR - DOSED IN MG/24 HR) 0.2 mg/hr patch, 1/4 patch daily   Current Outpatient Medications (Analgesics):  .  ibuprofen (ADVIL,MOTRIN) 200 MG tablet, Take 200 mg by mouth every 6 (six) hours as needed. Marland Kitchen  ibuprofen (ADVIL,MOTRIN) 800 MG tablet, Take 1 tablet (800 mg total) by mouth 3 (three) times daily.   Current  Outpatient Medications (Other):  Marland Kitchen  Diclofenac Sodium 2 % SOLN, Place 1-2 drops onto the skin 3 (three) times daily as needed. .  Multiple Vitamins-Minerals (MULTIVITAMIN WITH MINERALS) tablet, Take 1 tablet by mouth daily. .  Vitamin D, Ergocalciferol, (DRISDOL) 50000 units CAPS capsule, Take 1 capsule (50,000 Units total) by mouth every 7 (seven) days.    Past medical history, social, surgical and family history all reviewed in electronic medical record.  No pertanent information unless stated regarding to the chief complaint.   Review of Systems:  No headache, visual changes, nausea, vomiting, diarrhea, constipation, dizziness, abdominal pain, skin rash, fevers, chills, night sweats, weight loss, swollen lymph nodes, body aches, joint swelling, muscle aches, chest pain, shortness of breath, mood changes.   Objective  Blood pressure 122/82, pulse 74, height 5\' 7"  (1.702 m), weight 134 lb (60.8 kg), SpO2 98 %.    General: No apparent distress alert and oriented x3 mood and affect normal, dressed appropriately.  HEENT: Pupils equal, extraocular movements intact  Respiratory: Patient's speak in full sentences and does not appear short of breath  Cardiovascular: No lower extremity edema, non tender, no erythema  Skin: Warm dry intact with no signs of infection or rash on extremities or on axial skeleton.  Abdomen: Soft nontender  Neuro: Cranial nerves II through XII are intact, neurovascularly intact in all extremities with 2+ DTRs and 2+ pulses.  Lymph: No lymphadenopathy of posterior or anterior cervical chain or axillae bilaterally.  Gait normal with good balance and coordination.  MSK:  Non tender with full range of motion and good stability and symmetric strength and tone of elbows, wrist, hip, knee and ankles bilaterally.   Left shoulder does have mild limited because of the findings of the [facility] joint.  Less tender and swelling noted as well. Patient has full range of motion of the  shoulder.  5 out of 5 strength of the rotator cuff.  Positive crossover.  Neurovascularly intact distally  Right hand exam of the third MCP does have some mild tenderness but no abnormality.  Full range of motion.  Left elbow has surgical changes.  Full range of motion.  Mild tenderness at the tip of the olecranon otherwise unremarkable.  Limited musculoskeletal ultrasound was performed and interpreted by Judi Saa  Limited ultrasound of patient's acromial clavicular joint shows that the hemarthrosis is significantly better and the separation seems to be improving as well.  Patient does have some bony callus formation at the area of the ligamentous insertion. Impression: Resolving AC l separation and hemarthrosis that is improving but not completely resolved   Impression and Recommendations:  The above documentation has been reviewed and is accurate and complete Marcus Raymond Pulley, DO       Note: This dictation was prepared with Dragon dictation along with smaller phrase technology. Any transcriptional errors that result from this process are unintentional.

## 2018-07-26 ENCOUNTER — Ambulatory Visit (INDEPENDENT_AMBULATORY_CARE_PROVIDER_SITE_OTHER): Payer: BC Managed Care – PPO | Admitting: Family Medicine

## 2018-07-26 ENCOUNTER — Ambulatory Visit: Payer: Self-pay

## 2018-07-26 ENCOUNTER — Encounter: Payer: Self-pay | Admitting: Family Medicine

## 2018-07-26 VITALS — BP 122/82 | HR 74 | Ht 67.0 in | Wt 134.0 lb

## 2018-07-26 DIAGNOSIS — G8929 Other chronic pain: Secondary | ICD-10-CM

## 2018-07-26 DIAGNOSIS — M25512 Pain in left shoulder: Principal | ICD-10-CM

## 2018-07-26 DIAGNOSIS — S63652A Sprain of metacarpophalangeal joint of right middle finger, initial encounter: Secondary | ICD-10-CM

## 2018-07-26 DIAGNOSIS — S43102D Unspecified dislocation of left acromioclavicular joint, subsequent encounter: Secondary | ICD-10-CM | POA: Diagnosis not present

## 2018-07-26 DIAGNOSIS — S63619A Unspecified sprain of unspecified finger, initial encounter: Secondary | ICD-10-CM | POA: Insufficient documentation

## 2018-07-26 DIAGNOSIS — S52022S Displaced fracture of olecranon process without intraarticular extension of left ulna, sequela: Secondary | ICD-10-CM

## 2018-07-26 NOTE — Assessment & Plan Note (Signed)
History of a fracture.  Patient's no has no significant findings on exam today except some mild discomfort.  We discussed with patient in great length about icing regimen and home exercises.  Discussed which activities of doing which wants to avoid.  Patient is to increase activity slowly over the course the next several days.  Patient will follow-up with me again in 4 to 8 weeks

## 2018-07-26 NOTE — Assessment & Plan Note (Signed)
Sprain, no angulation no signs of any type of fracture.  Discussed buddy taping, follow-up again in 4 weeks

## 2018-07-26 NOTE — Patient Instructions (Addendum)
Good to see you  The shoulder is getting a little better and should be near pain free in 6-8 weeks Arnica lotion to the elbow and warm compressions 10 minutes 1-2 times a day could help  See em again in 6-8 weeks!

## 2018-07-26 NOTE — Assessment & Plan Note (Signed)
Improvement at the moment.  He did have a hemarthrosis that is responding at the moment.  Discussed icing regimen and home exercise.  Patient will follow-up with me again in 6 to 8 weeks if not completely resolved

## 2018-08-12 ENCOUNTER — Encounter: Payer: Self-pay | Admitting: Family Medicine

## 2018-09-20 ENCOUNTER — Ambulatory Visit: Payer: BC Managed Care – PPO | Admitting: Family Medicine

## 2018-09-21 ENCOUNTER — Telehealth: Payer: Self-pay | Admitting: Family Medicine

## 2018-09-21 NOTE — Telephone Encounter (Signed)
Spoke with patient in regard to rescheduling appt cancelled for 1/16 w/ Smith through the automated system.  Patient states he is doing much better and does not need the appt.

## 2018-10-16 ENCOUNTER — Encounter: Payer: Self-pay | Admitting: Family Medicine

## 2018-10-16 ENCOUNTER — Ambulatory Visit: Payer: BC Managed Care – PPO | Admitting: Family Medicine

## 2018-10-16 VITALS — BP 124/80 | HR 68 | Temp 98.1°F | Ht 66.0 in | Wt 134.4 lb

## 2018-10-16 DIAGNOSIS — R109 Unspecified abdominal pain: Secondary | ICD-10-CM | POA: Diagnosis not present

## 2018-10-16 NOTE — Progress Notes (Signed)
   Subjective:    Patient ID: Marcus Raymond, male    DOB: Nov 23, 1955, 63 y.o.   MRN: [id number]  HPI He is here for a second opinion concerning difficulty since mid December.  He states that started him in December he felt as if he was just off in terms of his health.  At that point he did have URI symptoms of sore throat, nasal and chest congestion but did get better with that but still felt like he was not back to his normal self although he was able to work out and run without difficulty.  He did have some slight difficulty with rhinorrhea but no sneezing, itchy watery eyes.  He also complains of some slight mid epigastric soreness but states that this tends to go away when he eats.  He was seen on January 13 and recommended using Rhinocort   Review of Systems     Objective:   Physical Exam Alert and in no distress. Tympanic membranes and canals are normal. Pharyngeal area is normal. Neck is supple without adenopathy or thyromegaly. Cardiac exam shows a regular sinus rhythm without murmurs or gallops. Lungs are clear to auscultation.        Assessment & Plan:  Abdominal distress Since eating does not help his abdominal stress recommend he eat more frequent but smaller meals.  Also discussed the other symptoms he is having and since nothing seems to jump out, recommend he keep track of the symptoms and see if anything can be associated otherwise continue to be followed by his regular physician.  He was comfortable with that recommendation.

## 2018-11-13 IMAGING — CR DG SHOULDER 2+V*L*
3 series · 3 of 3 positions shown · non-contrast
Comparison: None.

CLINICAL DATA: The patient suffered a fall with a injury to the
left shoulder playing tennis this morning.

EXAM:
LEFT SHOULDER - 2+ VIEW

[w shoulder external left]
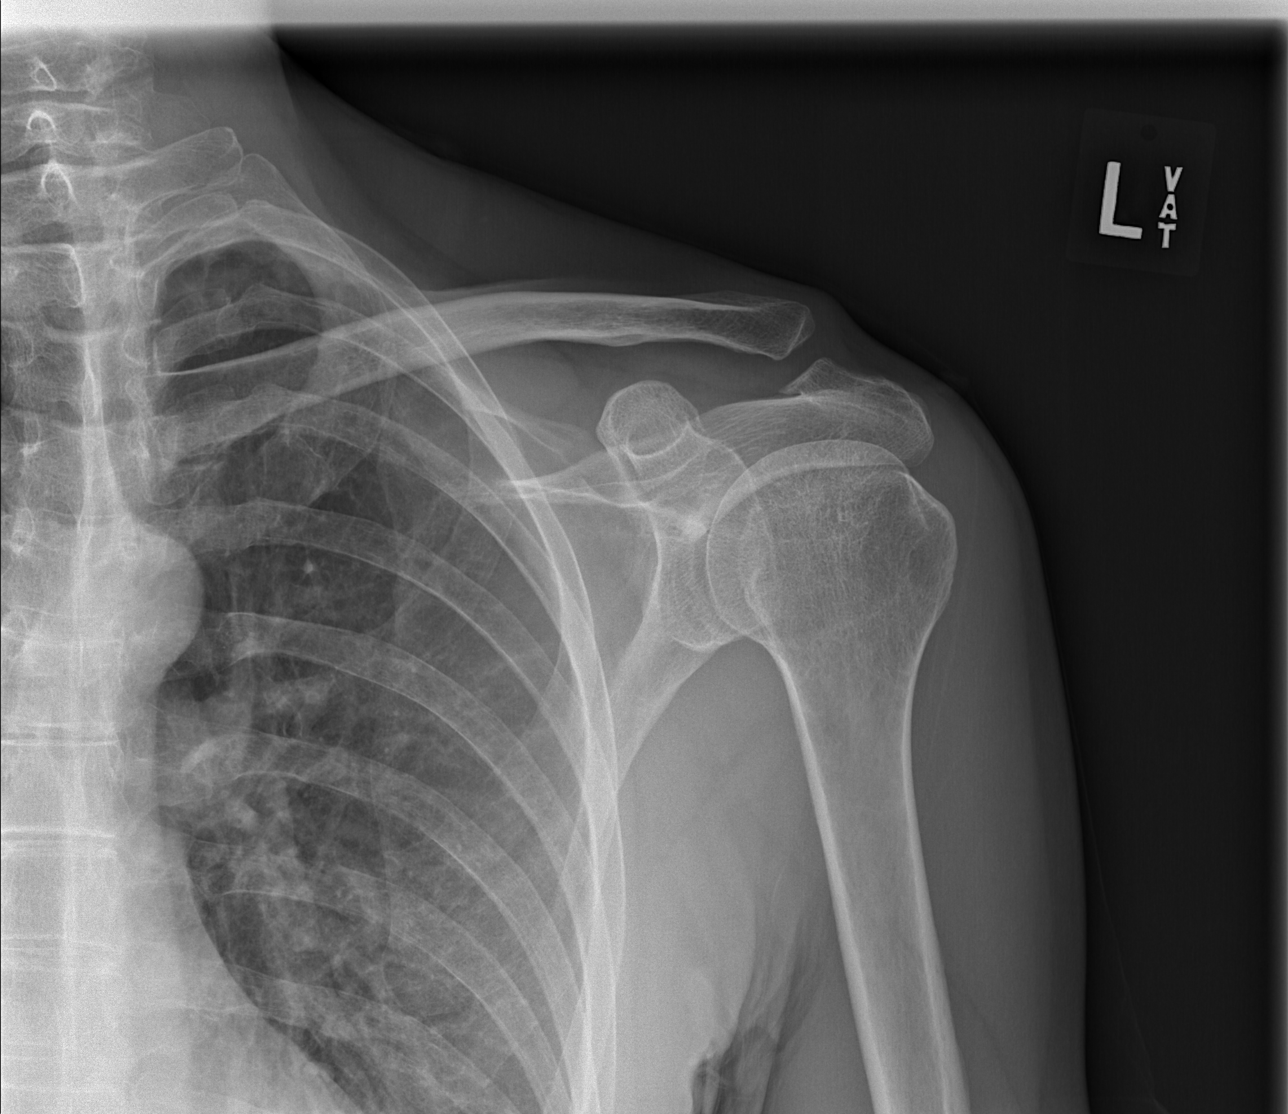

[w shoulder y-view left]
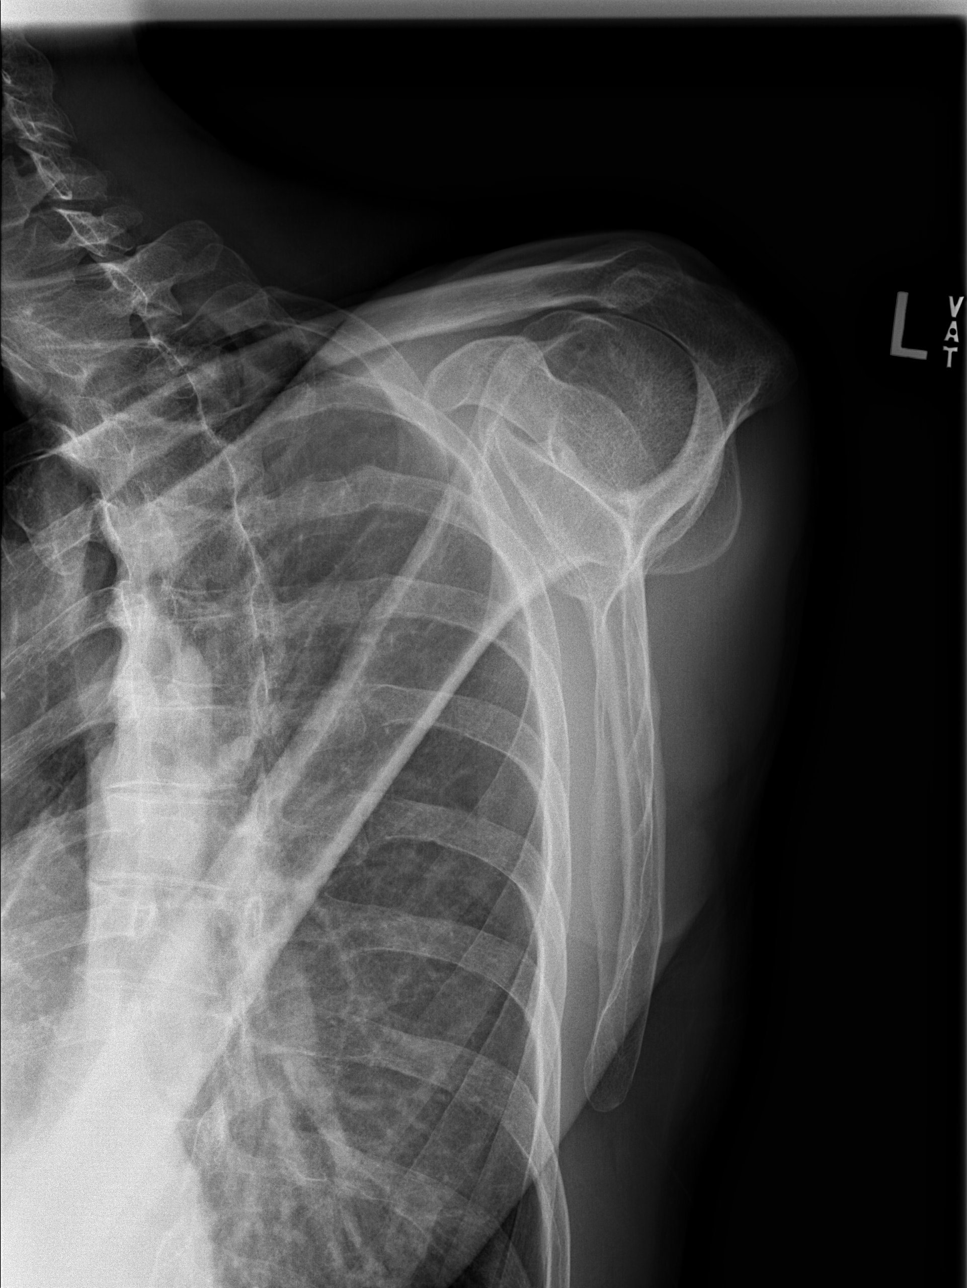

[x shoulder axillary left]
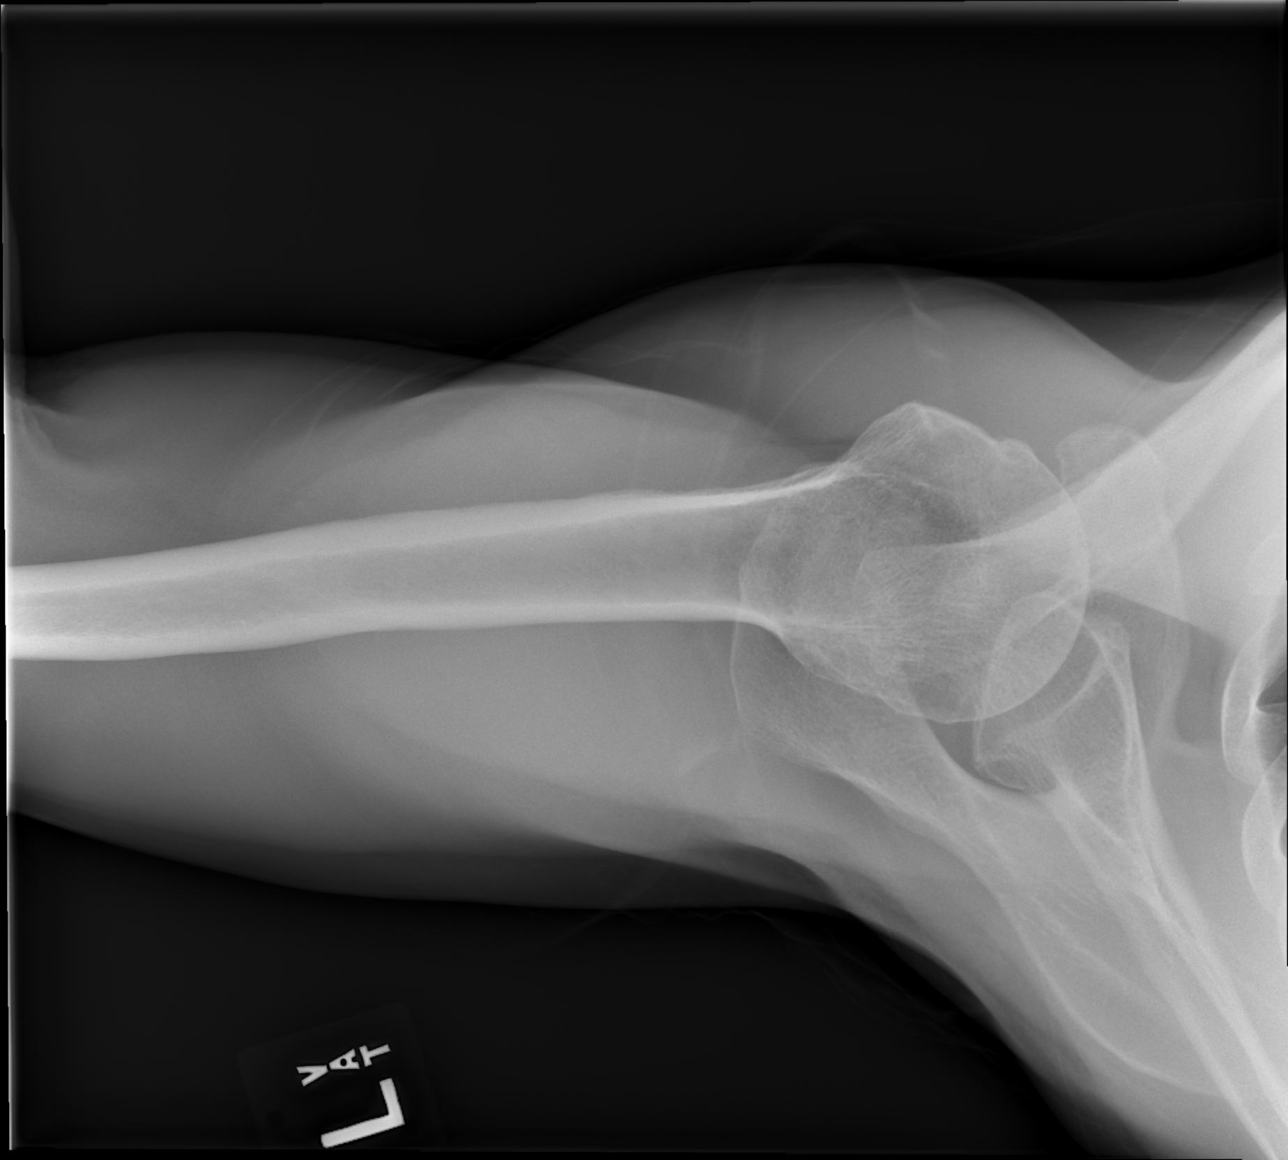

[3 of 3 positions shown; findings below may reference images not displayed]

FINDINGS: The humeral head is located. No fracture is identified. The distal
clavicle is mildly elevated relative to the acromion suggestive of
grade 3 acromioclavicular joint separation. Imaged left lung and
ribs appear normal.
IMPRESSION: Mild elevation of the clavicular head relative to the acromion
suggestive of grade 3 AC joint separation. The exam is otherwise
negative.

## 2018-12-11 ENCOUNTER — Other Ambulatory Visit: Payer: Self-pay

## 2018-12-11 ENCOUNTER — Ambulatory Visit (INDEPENDENT_AMBULATORY_CARE_PROVIDER_SITE_OTHER): Payer: BC Managed Care – PPO | Admitting: Internal Medicine

## 2018-12-11 ENCOUNTER — Encounter: Payer: Self-pay | Admitting: Internal Medicine

## 2018-12-11 DIAGNOSIS — R1013 Epigastric pain: Secondary | ICD-10-CM | POA: Diagnosis not present

## 2018-12-11 NOTE — Progress Notes (Signed)
HISTORY OF PRESENT ILLNESS:  Marcus Raymond is a 63 y.o. 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  I saw the patient previously for colonoscopy in 2008 (small adenomas) and 2014 (normal exam).  The patient is generally in excellent health.  He tells me that his difficulties began December 2019 when after returning from a [place] to [place] and associating with sick contacts here in the states he subsequently developed similar upper respiratory type symptoms.  Due to ongoing issues with runny nose and fatigue he saw his primary care provider mid January 2020 and was prescribed nasal spray.  He was seen later that month with complaints of wheezing and dizziness.  He was reassured.  He was able to maintain his normal activities without issue as well as maintain a good appetite.  In mid February he began to describe a dull epigastric and midabdominal ache.  No specific features though did result in anxiety.  His primary care provider felt this may represent acid reflux and prescribed omeprazole 20 mg daily for 30 days.  Essentially no change in symptoms except for 3 days.  His omeprazole was increased to 40 mg daily which she has been on for about 15 days.  Because of ongoing symptoms he was evaluated by his primary provider April 2.  As well, for his routine aviation physical exam.  He tells me that his physical examination was unremarkable.  As well extensive blood work unremarkable except for mildly elevated glucose which did not concern his provider.  Pepcid AC was added.  The provider suggested that, aside from the viral pandemic, he would probably refer to a gastroenterologist for endoscopy.  Patient then initiated this appointment.  He tells me that his appetite has been good.  Meals do not affect the symptoms.  No  change in weight.  Activity, if anything, helps.  Occasional alcoholic beverage seems to help.  He possibly describes mild dysphasia but says he has had this over the years without change.  He does tell me that in his 30s he did have issues with self-induced postprandial vomiting.  Nothing in recent years.  He continues with persistent dull ache which he does not notice until he begins his day.  No problems upon awakening and no nocturnal symptoms.  Of note, the patient denies any classic reflux symptoms such as pyrosis, regurgitation, or water brash.  REVIEW OF SYSTEMS:  All non-GI ROS negative unless otherwise stated in the HPI   Past Medical History:  Diagnosis Date  . History of colon polyps 2008   Tubular Adenoma, Dr Marina GoodellPerry , due 2013    Past Surgical History:  Procedure Laterality Date  . COLONOSCOPY W/ POLYPECTOMY  2008   Tubular Adenoma  . ORIF ELBOW FRACTURE  07/27/2012   Procedure: OPEN REDUCTION INTERNAL FIXATION (ORIF) ELBOW/OLECRANON FRACTURE;  Surgeon: Kathryne Hitchhristopher Y Blackman, MD;  Location: [facility];  Service: Orthopedics;  Laterality: Left;  Open Reduction Internal Fixation left elbow olecranon  . ORIF ELBOW FRACTURE  09/07/2012   Procedure: OPEN REDUCTION INTERNAL FIXATION (ORIF) ELBOW/OLECRANON FRACTURE;  Surgeon: Kathryne Hitchhristopher Y Blackman, MD;  Location: [facility];  Service: Orthopedics;  Laterality: Left;  Revision open reduction internal fixation left elbow olecranon fracture    Social History Marcus Raymond  reports that he has never smoked. He has never used smokeless tobacco. He reports current alcohol use. He reports  that he does not use drugs.  family history includes Colon polyps (age of onset: 16) in his father; Hypertension in his mother; Skin cancer in his mother.  Allergies  Allergen Reactions  . Sulfonamide Derivatives     Per pt: as child       PHYSICAL EXAMINATION: Virtual telemedicine visit.  Thus no physical exam    ASSESSMENT:  1.  Vague stable  dull epigastric discomfort as described.  No alarm features.  No response to PPI therapy for 6 weeks.  At this juncture, I doubt GERD.  As well, doubt ulcer disease.  I suspect that there may be a component of health-related anxiety.  We discussed this entity. 2.  History of adenomatous colon polyps.  Negative examination August 2014   PLAN:  1.  Reassurance 2.  Discussed the prospects of diagnostic upper endoscopy.  This was offered.  The patient elects to wait at this time but is comforted to know that we may proceed with an endoscopic examination if needed.  He also agrees to keep me posted with regards to how he is feeling.  As well, we discussed the prospects of advanced imaging depending upon how he does in the near future. 3.  Discontinue acid suppressive therapies as they have not been helpful 4.  Repeat screening colonoscopy around 2024  This virtual telemedicine visit was initiated by the patient who consented to this visit.  The patient was in his office and I was in my office during the interaction which took approximately 45 minutes.

## 2019-02-12 ENCOUNTER — Other Ambulatory Visit: Payer: Self-pay

## 2019-02-12 ENCOUNTER — Ambulatory Visit (INDEPENDENT_AMBULATORY_CARE_PROVIDER_SITE_OTHER): Payer: BC Managed Care – PPO | Admitting: Orthopaedic Surgery

## 2019-02-12 ENCOUNTER — Ambulatory Visit: Payer: BC Managed Care – PPO

## 2019-02-12 ENCOUNTER — Encounter: Payer: Self-pay | Admitting: Orthopaedic Surgery

## 2019-02-12 DIAGNOSIS — S62336A Displaced fracture of neck of fifth metacarpal bone, right hand, initial encounter for closed fracture: Secondary | ICD-10-CM

## 2019-02-12 DIAGNOSIS — M79641 Pain in right hand: Secondary | ICD-10-CM | POA: Diagnosis not present
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
## 2019-02-14 ENCOUNTER — Encounter: Payer: Self-pay | Admitting: Orthopaedic Surgery

## 2019-02-26 ENCOUNTER — Ambulatory Visit (INDEPENDENT_AMBULATORY_CARE_PROVIDER_SITE_OTHER): Payer: BC Managed Care – PPO | Admitting: Orthopaedic Surgery

## 2019-02-26 ENCOUNTER — Other Ambulatory Visit: Payer: Self-pay

## 2019-02-26 ENCOUNTER — Encounter: Payer: Self-pay | Admitting: Orthopaedic Surgery

## 2019-02-26 ENCOUNTER — Ambulatory Visit: Payer: BC Managed Care – PPO

## 2019-02-26 DIAGNOSIS — S62336D Displaced fracture of neck of fifth metacarpal bone, right hand, subsequent encounter for fracture with routine healing: Secondary | ICD-10-CM

## 2019-02-26 DIAGNOSIS — S62336A Displaced fracture of neck of fifth metacarpal bone, right hand, initial encounter for closed fracture: Secondary | ICD-10-CM
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
## 2019-03-27 ENCOUNTER — Other Ambulatory Visit: Payer: Self-pay

## 2019-03-27 ENCOUNTER — Ambulatory Visit (INDEPENDENT_AMBULATORY_CARE_PROVIDER_SITE_OTHER): Payer: BC Managed Care – PPO

## 2019-03-27 ENCOUNTER — Ambulatory Visit (INDEPENDENT_AMBULATORY_CARE_PROVIDER_SITE_OTHER): Payer: BC Managed Care – PPO | Admitting: Orthopaedic Surgery

## 2019-03-27 ENCOUNTER — Encounter: Payer: Self-pay | Admitting: Orthopaedic Surgery

## 2019-03-27 VITALS — Ht 67.0 in | Wt 130.0 lb

## 2019-03-27 DIAGNOSIS — S62336D Displaced fracture of neck of fifth metacarpal bone, right hand, subsequent encounter for fracture with routine healing: Secondary | ICD-10-CM

## 2019-03-27 NOTE — Progress Notes (Signed)
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  Marcus Raymond is well-known to me.  He is 6 weeks out from a 5th metacarpal neck fracture that was minimally displaced.  He has no issues at all.  We first treated with splinting.  On exam there is no cosmetic deformity with his fifth metacarpal on the right side.  His range of motion is full.  It was stiff at first but is getting much better.  There is no rotation deformity.  You have 3 views of his right hand are obtained and show significant interval healing of the metacarpal neck fracture.  This point he should go on to full healing.  He will be careful with his running activities and high impact activities using that hand until he is pain-free.  All question concerns were answered addressed.  Follow-up can be as needed.

## 2019-06-12 ENCOUNTER — Other Ambulatory Visit: Payer: Self-pay

## 2019-06-12 DIAGNOSIS — Z20822 Contact with and (suspected) exposure to covid-19: Secondary | ICD-10-CM

## 2019-06-13 LAB — NOVEL CORONAVIRUS, NAA: SARS-CoV-2, NAA: NOT DETECTED

## 2019-07-25 ENCOUNTER — Ambulatory Visit: Payer: BC Managed Care – PPO | Admitting: Allergy

## 2019-07-25 ENCOUNTER — Encounter: Payer: Self-pay | Admitting: Allergy

## 2019-07-25 ENCOUNTER — Other Ambulatory Visit: Payer: Self-pay

## 2019-07-25 VITALS — BP 126/78 | HR 68 | Temp 97.2°F | Resp 18 | Ht 66.34 in | Wt 130.5 lb

## 2019-07-25 DIAGNOSIS — R1013 Epigastric pain: Secondary | ICD-10-CM | POA: Diagnosis not present

## 2019-07-25 DIAGNOSIS — T781XXD Other adverse food reactions, not elsewhere classified, subsequent encounter: Secondary | ICD-10-CM | POA: Diagnosis not present

## 2019-07-25 NOTE — Patient Instructions (Addendum)
Today's skin testing showed: Negative to adult food panel.  Definitely follow up with your PCP regarding the glucose monitor.  If you still have issues with increased burping, please follow up with your PCP and your gastroenterologist for possible endoscopy.   Follow up with me as needed.

## 2019-07-25 NOTE — Progress Notes (Signed)
New Patient Note  RE: Marcus Raymond MRN: [id number] DOB: 03-01-1956 Date of Office Visit: 07/25/2019  Referring provider: No ref. provider found Primary care provider: Milus Banister, MD  Chief Complaint: Allergy Testing, Fatigue, and Food Intolerance  History of Present Illness: I had the pleasure of seeing Marcus Raymond for initial evaluation at the [facility] on 07/26/2019. He is a 63 y.o. male, who is self-referred here for the evaluation of possible food allergy.  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 He got a cold last winter after he came back traveling from [place]. Patient was not tested for COVID-19 at that time.   Patient noticed some waves of fatigue and epigastric discomfort ever since then.   He went to see his PCP and had some bloodwork drawn at that time and fasting BGM was slightly elevated. No history of diabetes.  He was started on omeprazole and Pepcid which made his symptoms worse so he stopped taking it.  He actually felt better after stopping the medications and felt like himself throughout the summer.  Patient went to see his PCP in September 2020 and repeat labs showed the slightly elevated fasting blood glucose again.   About 4-5 weeks ago, some of his symptoms have returned. He woke up one morning and felt a little off with lightheadedness, fatigue and nausea.   Patient concerned if it has to do with any food allergies/intolerances as he noticed some correlation after meals usually. He feels tired, nauseous and has brain fog after eating certain foods. These are mainly high in processed sugars and simple carbohydrates but no specific food triggers noted.   For example, this morning patient felt fine. He has eliminated all simple sugars and have been consuming more complex carbohydrates and proteins which may have been helping.   He had yogurt, fresh blueberries and felt a little off after  eating it which lasted for about 60 minutes but this can vary.   Denies any hives, swelling, wheezing, abdominal pain, diarrhea, vomiting. Denies any associated cofactors such as exertion, infection, NSAID use, or alcohol consumption.   Past work up includes: none.  Dietary History: patient has been eating other foods including milk, eggs, peanut, treenuts, sesame, shellfish, seafood, soy, wheat, meats, fruits and vegetables.  He had covid-19 antibody testing 1 month ago which was negative.   07/16/2019 labs: CBC diff, CMP, THS, testosterone level - unremarkable except for fasting glucose of 110.   Patient is scheduled back to see his PCP to get some type of continuous glucose monitor to see if the levels vary when he experiences these symptoms.  No previous history of food allergies.  Assessment and Plan: [name] is a 63 y.o. male with: Adverse food reaction Patient concerned for possible food allergies/intolerances.  He has been having epigastric discomfort, fatigue, brain fog and nausea at times after meals.  This started initially after he had cold-like symptoms after traveling in [place] last winter.  He was not COVID tested at that time but subsequent COVID-19 antibody testing more than 3 months after symptoms were negative.  To date the only abnormal work-up was slightly elevated fasting blood glucose.  No prior history of diabetes.  Tried omeprazole and Pepcid with no benefit.  He also had telemedicine visit with GI.  Discussed with patient at length that his symptoms are not typical of an IgE mediated food reaction which is what skin prick testing looks for.  However happy to rule it out today as a negative skin prick testing has about a 95% negative predictive value. Patient would like to pursue.  Today's skin testing showed: Negative to adult food panel.  Recommend that he follows up with his PCP regarding the glucose monitor system.  No indication to eliminate any foods at this  time.  Keeping a food diary may be beneficial as well to see if he is able to identify any trigger foods.   Epigastric pain Epigastric pain showed no improvement after omeprazole and Pepcid.  Patient also mentions increased dyspepsia at times.  Recommend to follow-up with gastroenterology if the symptoms continue for possible endoscopy.  Return if symptoms worsen or fail to improve.  Other allergy screening: Asthma: no Rhino conjunctivitis: yes  Mild rhino conjunctivitis symptoms in the spring. Does not take medications for this.  Medication allergy: yes  Sulfa - had a reaction as a child?  Hymenoptera allergy: no Urticaria: no Eczema:no History of recurrent infections suggestive of immunodeficency: no  Diagnostics: Skin Testing: Food allergy panel. Negative test to: Food allergy panel.  Results discussed with patient/family. Food Adult Perc - 07/25/19 1400    Time Antigen Placed  1427    Allergen Manufacturer  Lavella Hammock    Location  Back    Number of allergen test  74     Control-buffer 50% Glycerol  Negative    Control-Histamine 1 mg/ml  2+    1. Peanut  Negative    2. Soybean  Negative    3. Wheat  Negative    4. Sesame  Negative    5. Milk, cow  Negative    6. Egg White, Chicken  Negative    7. Casein  Negative    8. Shellfish Mix  Negative    9. Fish Mix  Negative    10. Cashew  Negative    11. Pecan Food  Negative    12. Simms  Negative    13. Almond  Negative    14. Hazelnut  Negative    15. [place] nut  Negative    16. Coconut  Negative    17. Pistachio  Negative    18. Catfish  Negative    19. Bass  Negative    20. Trout  Negative    21. Tuna  Negative    22. Salmon  Negative    23. Flounder  Negative    24. Codfish  Negative    25. Shrimp  Negative    26. Crab  Negative    27. Lobster  Negative    28. Oyster  Negative    29. Scallops  Negative    30. Barley  Negative    31. Oat   Negative    32. Rye   Negative    33. Hops  Negative    34.  Rice  Negative    35. Cottonseed  Negative    36. Saccharomyces Cerevisiae   Negative    37. Pork  Negative    38. [place] Meat  Negative    39. Chicken Meat  Negative    40. Beef  Negative    41. Lamb  Negative    42. Tomato  Negative    43. White Potato  Negative    44. Sweet Potato  Negative    45. Pea, Green/English  Negative    46. Navy Bean  Negative    47. Mushrooms  Negative    48. Avocado  Negative    49. Onion  Negative    50. Cabbage  Negative    51. Carrots  Negative    52. Celery  Negative    53. Corn  Negative    54. Cucumber  Negative    55. Grape (White seedless)  Negative    56. Orange   Negative    57. Banana  Negative    58. Apple  Negative    59. Peach  Negative    60. Strawberry  Negative    61. Cantaloupe  Negative    62. Watermelon  Negative    63. Pineapple  Negative    64. Chocolate/Cacao bean  Negative    65. Karaya Gum  Negative    66. Acacia (Arabic Gum)  Negative    67. Cinnamon  Negative    68. Nutmeg  Negative    69. Ginger  Negative    70. Garlic  Negative    71. Pepper, black  Negative    72. Mustard  Negative       Past Medical History: Patient Active Problem List   Diagnosis Date Noted  . Adverse food reaction 07/26/2019  . Epigastric pain 07/26/2019  . Finger sprain 07/26/2018  . Hemarthrosis, shoulder region, left 06/26/2018  . Separation of left acromioclavicular joint 05/22/2018  . Closed olecranon fracture 09/07/2012  . Fracture of left olecranon process 07/27/2012  . Other abnormal glucose 12/22/2010  . COLONIC POLYPS, HX OF 12/16/2008   Past Medical History:  Diagnosis Date  . History of colon polyps 2008   Tubular Adenoma, Dr Marina Goodell , due 2013   Past Surgical History: Past Surgical History:  Procedure Laterality Date  . COLONOSCOPY W/ POLYPECTOMY  2008   Tubular Adenoma  . ORIF ELBOW FRACTURE  07/27/2012   Procedure: OPEN REDUCTION INTERNAL FIXATION (ORIF) ELBOW/OLECRANON FRACTURE;  Surgeon: [name], MD;  Location: [facility];  Service: Orthopedics;  Laterality: Left;  Open Reduction Internal Fixation left elbow olecranon  . ORIF ELBOW FRACTURE  09/07/2012   Procedure: OPEN REDUCTION INTERNAL FIXATION (ORIF) ELBOW/OLECRANON FRACTURE;  Surgeon: [name], MD;  Location: [facility];  Service: Orthopedics;  Laterality: Left;  Revision open reduction internal fixation left elbow olecranon fracture   Medication List:  Current Outpatient Medications  Medication Sig Dispense Refill  . cholecalciferol (VITAMIN D3) 25 MCG (1000 UT) tablet Take 1,000 Units by mouth daily.    Marland Kitchen ibuprofen (ADVIL,MOTRIN) 200 MG tablet Take 200 mg by mouth every 6 (six) hours as needed.    . Multiple Vitamins-Minerals (MULTIVITAMIN WITH MINERALS) tablet Take 1 tablet by mouth daily.    . Omega-3 Fatty Acids (FISH OIL PO) Take by mouth.    . budesonide (GNP BUDESONIDE NASAL SPRAY) 32 MCG/ACT nasal spray Place 2 sprays into both nostrils daily.    . famotidine (PEPCID) 20 MG tablet Take 20 mg by mouth daily.    Marland Kitchen omeprazole (PRILOSEC) 20 MG capsule Take 20 mg by mouth daily.     No current facility-administered medications for this visit.    Allergies: Allergies  Allergen Reactions  . Sulfonamide Derivatives     Per pt: as child   Social History: Social History   Socioeconomic History  . Marital status: Significant Other    Spouse name: Not on file  . Number of children: Not on file  . Years of education: Not on file  . Highest education level: Not on file  Occupational History  . Not on file  Social Needs  . Financial resource strain: Not on file  . Food insecurity    Worry: Not on file    Inability: Not on file  . Transportation needs    Medical: Not on file    Non-medical: Not on file  Tobacco Use  . Smoking status: Never Smoker  . Smokeless tobacco: Never Used  Substance and Sexual Activity  . Alcohol use: Yes    Comment: daily wine or beer per pt   . Drug use: No  . Sexual  activity: Not on file  Lifestyle  . Physical activity    Days per week: Not on file    Minutes per session: Not on file  . Stress: Not on file  Relationships  . Social Musician on phone: Not on file    Gets together: Not on file    Attends religious service: Not on file    Active member of club or organization: Not on file    Attends meetings of clubs or organizations: Not on file    Relationship status: Not on file  Other Topics Concern  . Not on file  Social History Narrative  . Not on file   Lives in a 64 year old house. Smoking: denies Occupation: professor  Environmental HistorySurveyor, minerals in the house: no Engineer, civil (consulting) in the family room: no Carpet in the bedroom: no Heating: gas Cooling: central Pet: no  Family History: Family History  Problem Relation Age of Onset  . Colon polyps Father 77  . Hypertension Mother   . Skin cancer Mother        basal cell  . Fibromyalgia Sister   . Diabetes Paternal Uncle    Problem                               Relation Asthma                                   No  Eczema                                No  Food allergy                          No  Allergic rhino conjunctivitis     No   Review of Systems  Constitutional: Negative for appetite change, chills, fever and unexpected weight change.  HENT: Negative for congestion and rhinorrhea.   Eyes: Negative for itching.  Respiratory: Negative for cough, chest tightness, shortness of breath and wheezing.   Cardiovascular: Negative for chest pain.  Gastrointestinal: Positive for abdominal pain and nausea. Negative for constipation and vomiting.  Genitourinary: Negative for difficulty urinating.  Skin: Negative for rash.  Allergic/Immunologic: Negative for food allergies.  Neurological: Negative for headaches.   Objective: BP 126/78 (BP Location: Right Arm, Patient Position: Sitting, Cuff Size: Normal)   Pulse 68   Temp (!) 97.2 F (36.2 C) (Temporal)    Resp 18   Ht 5' 6.34" (1.685 m)   Wt 130 lb 8.2 oz (59.2 kg)   SpO2 99%   BMI 20.85 kg/m  Body mass index is 20.85 kg/m. Physical Exam  Constitutional: He is oriented to person, place, and time. He appears well-developed and  well-nourished.  HENT:  Head: Normocephalic and atraumatic.  Right Ear: External ear normal.  Left Ear: External ear normal.  Nose: Nose normal.  Mouth/Throat: Oropharynx is clear and moist.  Eyes: Conjunctivae and EOM are normal.  Neck: Neck supple.  Cardiovascular: Normal rate, regular rhythm and normal heart sounds. Exam reveals no gallop and no friction rub.  No murmur heard. Pulmonary/Chest: Effort normal and breath sounds normal. He has no wheezes. He has no rales.  Abdominal: Soft.  Neurological: He is alert and oriented to person, place, and time.  Skin: Skin is warm. No rash noted.  Psychiatric: He has a normal mood and affect. His behavior is normal.  Nursing note and vitals reviewed.  The plan was reviewed with the patient/family, and all questions/concerned were addressed.  It was my pleasure to see Earvin HansenGerald today and participate in his care. Please feel free to contact me with any questions or concerns.  Sincerely,  Wyline MoodYoon , DO Allergy & Immunology  [facility] [place] office: [phone] [facility] office: [phone] [place] office: [phone]

## 2019-07-26 DIAGNOSIS — R1013 Epigastric pain: Secondary | ICD-10-CM | POA: Insufficient documentation

## 2019-07-26 DIAGNOSIS — T781XXA Other adverse food reactions, not elsewhere classified, initial encounter: Secondary | ICD-10-CM | POA: Insufficient documentation

## 2019-07-26 NOTE — Assessment & Plan Note (Addendum)
Patient concerned for possible food allergies/intolerances.  He has been having epigastric discomfort, fatigue, brain fog and nausea at times after meals.  This started initially after he had cold-like symptoms after traveling in [place] last winter.  He was not COVID tested at that time but subsequent COVID-19 antibody testing more than 3 months after symptoms were negative.  To date the only abnormal work-up was slightly elevated fasting blood glucose.  No prior history of diabetes.  Tried omeprazole and Pepcid with no benefit.  He also had telemedicine visit with GI.  Discussed with patient at length that his symptoms are not typical of an IgE mediated food reaction which is what skin prick testing looks for.  However happy to rule it out today as a negative skin prick testing has about a 95% negative predictive value. Patient would like to pursue.  Today's skin testing showed: Negative to adult food panel.  Recommend that he follows up with his PCP regarding the glucose monitor system.  No indication to eliminate any foods at this time.  Keeping a food diary may be beneficial as well to see if he is able to identify any trigger foods.

## 2019-07-26 NOTE — Assessment & Plan Note (Signed)
Epigastric pain showed no improvement after omeprazole and Pepcid.  Patient also mentions increased dyspepsia at times.  Recommend to follow-up with gastroenterology if the symptoms continue for possible endoscopy.

## 2021-10-07 ENCOUNTER — Ambulatory Visit: Payer: BC Managed Care – PPO | Admitting: Physician Assistant

## 2022-04-12 ENCOUNTER — Ambulatory Visit: Payer: Self-pay

## 2022-04-12 ENCOUNTER — Telehealth: Payer: Self-pay | Admitting: Orthopaedic Surgery

## 2022-04-12 ENCOUNTER — Encounter: Payer: Self-pay | Admitting: Physician Assistant

## 2022-04-12 ENCOUNTER — Ambulatory Visit: Payer: BC Managed Care – PPO | Admitting: Physician Assistant

## 2022-04-12 DIAGNOSIS — M25571 Pain in right ankle and joints of right foot: Secondary | ICD-10-CM | POA: Diagnosis not present

## 2022-04-12 NOTE — Telephone Encounter (Signed)
Called pt to get sch. LVM#1  

## 2022-04-12 NOTE — Progress Notes (Signed)
Office Visit Note   Patient: Marcus Raymond           Date of Birth: 1956/02/23           MRN: [id number] Visit Date: 04/12/2022              Requested by: Milus Banister, MD [address] [place],  [place] [address] PCP: Milus Banister, MD  Chief Complaint  Patient presents with   Right Ankle - Pain      HPI: The patient is a pleasant active 66 year old gentleman who presents with a 1 day history of right lateral ankle pain.  He has always been very active and is a runner.  He was up on a ladder yesterday and his foot slipped and he came down a couple rungs below and had pain in his right ankle.  He did not fall to the ground.  He was able to ambulate and did not think much of it but then he began having more lateral pain and swelling.  He is doing ice and Voltaren he wants to make sure he did not do anything significant to his ankle.  Assessment & Plan: Visit Diagnoses:  1. Pain in right ankle and joints of right foot     Plan: He is mostly tender over the ATFL complex which is where his swelling is isolated to.  Given the mechanism of injury most likely a contusion to the bone or an ankle sprain with inversion.  I have recommended immobilization in an ASO brace alphabet exercises and continued symptomatic treatment.  I would expect this would get better over the next couple weeks.  He will follow-up if he does not have improvement.  Follow-Up Instructions: No follow-ups on file.   Ortho Exam  Patient is alert, oriented, no adenopathy, well-dressed, normal affect, normal respiratory effort. Examination of his right ankle he does have mild to moderate soft tissue swelling laterally.  No swelling in the foot no swelling in the ankle goal on the medial side.  He has good strength with plantarflexion dorsiflexion eversion inversion.  Good stability with anterior draw.  He is focally tender over the ATFL.  No tenderness really over the deltoid no swelling ecchymosis or  pain over the midfoot or Lisfranc complex  Imaging: No results found. No images are attached to the encounter.  Labs: Lab Results  Component Value Date   HGBA1C 5.9 12/22/2010     Lab Results  Component Value Date   ALBUMIN 4.2 12/07/2006    No results found for: "MG" No results found for: "VD25OH"  No results found for: "PREALBUMIN"    Latest Ref Rng & Units 09/07/2012    2:30 PM 07/27/2012    2:49 PM 12/07/2006   12:59 PM  CBC EXTENDED  WBC 4.0 - 10.5 K/uL 7.4  7.7  5.4   RBC 4.22 - 5.81 MIL/uL 5.05  4.59  5.09   Hemoglobin 13.0 - 17.0 g/dL 44.0  10.2  72.5   HCT 39.0 - 52.0 % 45.0  41.3  47.2   Platelets 150 - 400 K/uL 266  228  250   NEUT# 1.4 - 7.7 K/uL   3.0      There is no height or weight on file to calculate BMI.  Orders:  Orders Placed This Encounter  Procedures   XR Ankle Complete Right   No orders of the defined types were placed in this encounter.    Procedures: No procedures performed  Clinical Data: No additional findings.  ROS:  All other systems negative, except as noted in the HPI. Review of Systems  All other systems reviewed and are negative.   Objective: Vital Signs: There were no vitals taken for this visit.  Specialty Comments:  No specialty comments available.  PMFS History: Patient Active Problem List   Diagnosis Date Noted   Adverse food reaction 07/26/2019   Epigastric pain 07/26/2019   Finger sprain 07/26/2018   Hemarthrosis, shoulder region, left 06/26/2018   Separation of left acromioclavicular joint 05/22/2018   Closed olecranon fracture 09/07/2012   Fracture of left olecranon process 07/27/2012   Other abnormal glucose 12/22/2010   COLONIC POLYPS, HX OF 12/16/2008   Past Medical History:  Diagnosis Date   History of colon polyps 2008   Tubular Adenoma, Dr Marina Goodell , due 2013    Family History  Problem Relation Age of Onset   Colon polyps Father 63   Hypertension Mother    Skin cancer Mother        basal  cell   Fibromyalgia Sister    Diabetes Paternal Uncle     Past Surgical History:  Procedure Laterality Date   COLONOSCOPY W/ POLYPECTOMY  2008   Tubular Adenoma   ORIF ELBOW FRACTURE  07/27/2012   Procedure: OPEN REDUCTION INTERNAL FIXATION (ORIF) ELBOW/OLECRANON FRACTURE;  Surgeon: [name], MD;  Location: [facility];  Service: Orthopedics;  Laterality: Left;  Open Reduction Internal Fixation left elbow olecranon   ORIF ELBOW FRACTURE  09/07/2012   Procedure: OPEN REDUCTION INTERNAL FIXATION (ORIF) ELBOW/OLECRANON FRACTURE;  Surgeon: [name], MD;  Location: [facility];  Service: Orthopedics;  Laterality: Left;  Revision open reduction internal fixation left elbow olecranon fracture   Social History   Occupational History   Not on file  Tobacco Use   Smoking status: Never   Smokeless tobacco: Never  Vaping Use   Vaping Use: Never used  Substance and Sexual Activity   Alcohol use: Yes    Comment: daily wine or beer per pt    Drug use: No   Sexual activity: Not on file

## 2022-04-12 NOTE — Telephone Encounter (Signed)
Pt called and states he stepped off a ladder wrong and hurt his right ankle. Pt states he does not think he cant wait until 08/16. Can we work pot in or can pt see someone else?   CB [phone]

## 2023-02-02 NOTE — Progress Notes (Signed)
Marcus Payor, PhD, LAT, ATC acting as a scribe for Marcus Graham, MD.  Marcus Raymond is a 67 y.o. male who presents to [facility] at [facility] today for L knee pain x 3wks. Pt enjoys running, biking, hiking and is very active overall. He runs around 20-25 miles/wk. Pt locates pain to the anterior aspect of the L knee. He noticed some discomfort when doing squats and some soreness deep to the patella. He is wanting to be proactive.   He also c/o L foot pain x 2 wks, that he noticed when backpacking in [place]. Area of discomfort is along the lateral aspect of the dorsum of his L foot.  He notes he did more hiking than usual wearing a different shoe than is usual.   R Knee swelling: no Mechanical symptoms: no Aggravates: running, squats Treatments tried: rest  Pertinent review of systems: No fevers or chills  Relevant historical information: Otherwise healthy.  Patient is a Education officer, environmental at [employer]   Exam:  BP 120/82   Pulse 65   Ht 5\' 6"  (1.676 m)   Wt 132 lb (59.9 kg)   SpO2 99%   BMI 21.31 kg/m  General: Well Developed, well nourished, and in no acute distress.   MSK: Left knee: Normal-appearing Nontender. Normal knee motion. Able ligamentous exam. Intact strength.  Left foot: Relatively normal-appearing Nontender normal foot and ankle motion. Intact strength. Pulses cap refill and sensation are intact distally.    Lab and Radiology Results  Diagnostic Limited MSK Ultrasound of: Left knee and left foot Left knee: Normal-appearing MSK ultrasound of the knee.  Quad tendon and patellar tendon are normal-appearing no significant degeneration visible to joint lines.  Normal posterior knee.  No obvious patellar tendinitis.  Left foot dorsal.  Normal-appearing metatarsal bones and tarsometatarsal joints. Impression: Normal MSK ultrasound knee and foot.   X-ray images left knee and foot obtained today personally and independently  interpreted  Left knee: Accessory ossicle or old MCL avulsion fracture located at medial femoral condyle.  Minimal degenerative changes.  No acute fractures are visible.  Left foot: No acute fractures.  Minimal degenerative changes.  Await formal radiology review  Assessment and Plan: 67 y.o. male with left knee pain.  Pain is primarily anterior knee thought to be patellar tendinitis or patellofemoral pain syndrome.  He is a good candidate for PT trial for quadricep strengthening and better patellar control.  Recommend Voltaren gel as well.  Left dorsal foot pain.  He may have had an early developing stress reaction or irritation or inflammation of the tarsometatarsal joints due to change in activity and shoe fit.  He is no longer doing this activity.  Recommend arch straps return to running at about 50% preinjury workload advancing 10 %/week.  If needed we could assess this more thoroughly with an MRI.   PDMP not reviewed this encounter. Orders Placed This Encounter  Procedures   [place] LIMITED JOINT SPACE STRUCTURES LOW RIGHT(NO LINKED CHARGES)    Order Specific Question:   Reason for Exam (SYMPTOM  OR DIAGNOSIS REQUIRED)    Answer:   right knee pain    Order Specific Question:   Preferred imaging location?    Answer:   [facility] Knee AP/LAT W/Sunrise Left    Standing Status:   Future    Number of Occurrences:   1    Standing Expiration Date:   03/05/2023    Order Specific Question:  Reason for Exam (SYMPTOM  OR DIAGNOSIS REQUIRED)    Answer:   left knee pain    Order Specific Question:   Preferred imaging location?    Answer:   [facility]   DG Foot Complete Left    Standing Status:   Future    Number of Occurrences:   1    Standing Expiration Date:   02/03/2024    Order Specific Question:   Reason for Exam (SYMPTOM  OR DIAGNOSIS REQUIRED)    Answer:   eval foot pain dorsal    Order Specific Question:   Preferred imaging location?    Answer:    [facility]   Ambulatory referral to Physical Therapy    Referral Priority:   Routine    Referral Type:   Physical Medicine    Referral Reason:   Specialty Services Required    Requested Specialty:   Physical Therapy    Number of Visits Requested:   1   No orders of the defined types were placed in this encounter.    Discussed warning signs or symptoms. Please see discharge instructions. Patient expresses understanding.   The above documentation has been reviewed and is accurate and complete Marcus Raymond, M.D.

## 2023-02-03 ENCOUNTER — Ambulatory Visit (INDEPENDENT_AMBULATORY_CARE_PROVIDER_SITE_OTHER): Payer: BC Managed Care – PPO

## 2023-02-03 ENCOUNTER — Other Ambulatory Visit: Payer: Self-pay

## 2023-02-03 ENCOUNTER — Ambulatory Visit: Payer: BC Managed Care – PPO | Admitting: Family Medicine

## 2023-02-03 VITALS — BP 120/82 | HR 65 | Ht 66.0 in | Wt 132.0 lb

## 2023-02-03 DIAGNOSIS — M79672 Pain in left foot: Secondary | ICD-10-CM | POA: Diagnosis not present

## 2023-02-03 DIAGNOSIS — M25562 Pain in left knee: Secondary | ICD-10-CM | POA: Diagnosis not present

## 2023-02-03 NOTE — Patient Instructions (Addendum)
Thank you for coming in today.   Please get an Xray today before you leave.  I've referred you to Physical Therapy.  Let us know if you don't hear from them in one week.   Recheck in about 8 weeks.   Try an arch strap.

## 2023-02-06 NOTE — Progress Notes (Signed)
Left knee x-ray shows some soft tissue calcification that looks old.  Otherwise the x-ray looks okay.

## 2023-02-09 NOTE — Progress Notes (Signed)
Left foot x-ray looks normal to radiology

## 2023-02-16 ENCOUNTER — Encounter: Payer: Self-pay | Admitting: Physical Therapy

## 2023-02-16 ENCOUNTER — Ambulatory Visit: Payer: BC Managed Care – PPO | Admitting: Physical Therapy

## 2023-02-16 DIAGNOSIS — M25562 Pain in left knee: Secondary | ICD-10-CM | POA: Diagnosis not present

## 2023-02-16 NOTE — Therapy (Addendum)
OUTPATIENT PHYSICAL THERAPY LOWER EXTREMITY EVALUATION   Patient Name: Marcus Raymond MRN: [id number] DOB:1956/07/31, 67 y.o., male Today's Date: 02/16/2023  END OF SESSION:  PT End of Session - 02/16/23 0758     Visit Number 1    Number of Visits 16    Date for PT Re-Evaluation 04/13/23    Authorization Type BCBS    PT Start Time 0800    PT Stop Time 0840    PT Time Calculation (min) 40 min    Activity Tolerance Patient tolerated treatment well    Behavior During Therapy [facility] for tasks assessed/performed             Past Medical History:  Diagnosis Date   History of colon polyps 2008   Tubular Adenoma, Dr Marina Goodell , due 2013   Past Surgical History:  Procedure Laterality Date   COLONOSCOPY W/ POLYPECTOMY  2008   Tubular Adenoma   ORIF ELBOW FRACTURE  07/27/2012   Procedure: OPEN REDUCTION INTERNAL FIXATION (ORIF) ELBOW/OLECRANON FRACTURE;  Surgeon: [name], MD;  Location: [facility];  Service: Orthopedics;  Laterality: Left;  Open Reduction Internal Fixation left elbow olecranon   ORIF ELBOW FRACTURE  09/07/2012   Procedure: OPEN REDUCTION INTERNAL FIXATION (ORIF) ELBOW/OLECRANON FRACTURE;  Surgeon: [name], MD;  Location: [facility];  Service: Orthopedics;  Laterality: Left;  Revision open reduction internal fixation left elbow olecranon fracture   Patient Active Problem List   Diagnosis Date Noted   Adverse food reaction 07/26/2019   Epigastric pain 07/26/2019   Finger sprain 07/26/2018   Hemarthrosis, shoulder region, left 06/26/2018   Separation of left acromioclavicular joint 05/22/2018   Closed olecranon fracture 09/07/2012   Fracture of left olecranon process 07/27/2012   Other abnormal glucose 12/22/2010   COLONIC POLYPS, HX OF 12/16/2008     PCP: Dartha Lodge  REFERRING PROVIDER: Clementeen Graham   REFERRING DIAG: L knee pain  THERAPY DIAG:  Acute pain of left knee  Rationale for Evaluation and Treatment: Rehabilitation  ONSET  DATE: 2-3 weeks ago.    SUBJECTIVE:   SUBJECTIVE STATEMENT: Pt states mild pain in L knee for about 2-3 weeks, noted pain when he was doing squats at the gym. He normally lifts weights, swims, runs. Notes that he had prior injury to L knee several years ago that resolved and has been fine since.  He also has some pain in L foot, started when backpacking,  top of L foot was sore, Recently he has been doing a lot of walking, and is feeling pretty good. Still feeling it a little bit at end of longer distances.  Also notes R achilles is sore at times when running.   He works as Radio producer . Wears sauconys, with good comfort.   PERTINENT HISTORY: none   PAIN:  Are you having pain? Yes: NPRS scale: up to 4/10 Pain location:  Lknee , medial/ant Pain description: sore Aggravating factors: longer run/ longer duration activity  Relieving factors: none stated   PRECAUTIONS: None  WEIGHT BEARING RESTRICTIONS: No  FALLS:  Has patient fallen in last 6 months? No   PLOF: Independent  PATIENT GOALS:  Decreased pain, continue running   NEXT MD VISIT:   OBJECTIVE:   DIAGNOSTIC FINDINGS:   PATIENT SURVEYS:    COGNITION: Overall cognitive status: Within functional limits for tasks assessed     SENSATION: WFL  EDEMA:    POSTURE:    No Significant postural limitations, foot alignment: neutral  PALPATION: no  tenderness to palpate L foot , very mild tenderness in L patella tendon. Mild tenderness in R mid achilles.    LOWER EXTREMITY ROM: Lumbar: WFL Hips: WFL Knees: WFL Ankle: Mild limitation for DF R>L,   mild limitation for toe ext bil;    LOWER EXTREMITY MMT:  MMT Left eval Right  eval  Hip flexion 4+ 4+  Hip extension    Hip abduction 4+ 4+  Hip adduction    Hip internal rotation    Hip external rotation    Knee flexion 5 5  Knee extension 4+ 5  Ankle dorsiflexion    Ankle plantarflexion    Ankle inversion    Ankle eversion     (Blank rows = not  tested)   LOWER EXTREMITY SPECIAL TESTS:    FUNCTIONAL TESTS:    GAIT: unremarkable     TODAY'S TREATMENT:                                                                                                                              DATE:   02/16/2023 Ther ex:   Standing HR with education on form,  discussed calf strength on step .  DF glides (kneeling x 10 bil)  LAQ 5 lb, education on slow speed and slow lowering.   PATIENT EDUCATION:  Education details: PT POC, Exam findings, HEP, modifications of current strengthening program.  Person educated: Patient Education method: Explanation, Demonstration, Tactile cues, Verbal cues, and Handouts Education comprehension: verbalized understanding, returned demonstration, verbal cues required, tactile cues required, and needs further education   HOME EXERCISE PROGRAM: Access Code: [facility] URL: [email or web address] Date: 02/16/2023 Prepared by: Sedalia Muta  Exercises - Standing Ankle Dorsiflexion Stretch on Chair  - 1 x daily - 2 sets - 10 reps - Heel Raise  - 1 x daily - 1-2 sets - 10 reps  ASSESSMENT:  CLINICAL IMPRESSION: Patient presents with primary complaint of pain in L knee. Pain is mild, but increased at increased mileage. He also has mild stiffness in feet and R ankle, and mild pain in achilles. He will benefit from skilled PT to improve deficits, pain, and education on optimal strength and stabilization for LEs. Plan to review quad strength, and single leg stability at next visit. Reviewed current exercises that he is doing for optimal form today.  Pt with decreased ability for full functional activities. Pt will  benefit from skilled PT to improve deficits and pain and to return to PLOF.   OBJECTIVE IMPAIRMENTS: decreased activity tolerance, decreased ROM, decreased strength, and pain.   ACTIVITY LIMITATIONS: lifting, bending, squatting, stairs, and locomotion level  PARTICIPATION LIMITATIONS:  community activity  PERSONAL FACTORS:  none  are also affecting patient's functional outcome.   REHAB POTENTIAL: Good  CLINICAL DECISION MAKING: Stable/uncomplicated  EVALUATION COMPLEXITY: Low   GOALS: Goals reviewed with patient? Yes  SHORT TERM GOALS: Target date: 03/02/2023   Pt to be independent with initial HEP  Goal status:  INITIAL    LONG TERM GOALS: Target date: 04/13/2023   Pt to be independent with final HEP  Goal status: INITIAL  2.  Pt to report decreased pain in L knee to 0-2/10 with activity   Goal status: INITIAL  3.  Pt to demo LE strength and stability to be [facility] to improve stability with dynamic movement .   Goal status: INITIAL     PLAN:  PT FREQUENCY: 1-2x/week  PT DURATION: 8 weeks  PLANNED INTERVENTIONS: Therapeutic exercises, Therapeutic activity, Neuromuscular re-education, Patient/Family education, Self Care, Joint mobilization, Joint manipulation, Stair training, Orthotic/Fit training, DME instructions, Aquatic Therapy, Dry Needling, Electrical stimulation, Cryotherapy, Moist heat, Taping, Ultrasound, Ionotophoresis 4mg /ml Dexamethasone, Manual therapy,  Vasopneumatic device, Traction, Spinal manipulation, Spinal mobilization,Balance training, Gait training,   PLAN FOR NEXT SESSION:    Sedalia Muta, PT 02/14/2023, 9:15 AM  PHYSICAL THERAPY DISCHARGE SUMMARY  Visits from Start of Care: 1   Plan: Patient agrees to discharge.  Patient goals were not met. Patient is being discharged due to - pt did not return after Eval.   Sedalia Muta, PT, DPT 4:37 PM  08/14/23

## 2023-03-02 ENCOUNTER — Encounter: Payer: Self-pay | Admitting: Internal Medicine

## 2023-03-02 ENCOUNTER — Encounter: Payer: BC Managed Care – PPO | Admitting: Physical Therapy

## 2023-03-31 ENCOUNTER — Ambulatory Visit: Payer: BC Managed Care – PPO | Admitting: Family Medicine

## 2023-04-25 ENCOUNTER — Encounter: Payer: Self-pay | Admitting: Internal Medicine

## 2023-04-25 ENCOUNTER — Ambulatory Visit: Payer: BC Managed Care – PPO

## 2023-04-25 VITALS — Ht 67.0 in | Wt 130.0 lb

## 2023-04-25 DIAGNOSIS — Z86010 Personal history of colonic polyps: Secondary | ICD-10-CM

## 2023-04-25 MED ORDER — NA SULFATE-K SULFATE-MG SULF 17.5-3.13-1.6 GM/177ML PO SOLN: 1.0000 | Freq: Once | ORAL | 0 refills | Status: AC

## 2023-04-25 NOTE — Progress Notes (Signed)
 No egg or soy allergy known to patient  No issues known to pt with past sedation with any surgeries or procedures Patient denies ever being told they had issues or difficulty with intubation  No FH of Malignant Hyperthermia Pt is not on diet pills Pt is not on  home 02  Pt is not on blood thinners  Pt denies issues with chronic constipation  No A fib or A flutter Have any cardiac testing pending--no Patient's chart reviewed by Cathlyn Parsons CNRA prior to previsit and patient appropriate for the [facility].  Previsit completed and red dot placed by patient's name on their procedure day (on provider's schedule).   Ambulates independently Pt instructed to use [email or web address] or GoodRx for a price reduction on prep

## 2023-05-16 ENCOUNTER — Ambulatory Visit (AMBULATORY_SURGERY_CENTER): Payer: BC Managed Care – PPO | Admitting: Internal Medicine

## 2023-05-16 ENCOUNTER — Encounter: Payer: Self-pay | Admitting: Internal Medicine

## 2023-05-16 VITALS — BP 118/79 | HR 56 | Temp 98.6°F | Resp 14 | Ht 66.0 in | Wt 130.0 lb

## 2023-05-16 DIAGNOSIS — D122 Benign neoplasm of ascending colon: Secondary | ICD-10-CM

## 2023-05-16 DIAGNOSIS — Z86010 Personal history of colonic polyps: Secondary | ICD-10-CM | POA: Diagnosis not present

## 2023-05-16 DIAGNOSIS — D120 Benign neoplasm of cecum: Secondary | ICD-10-CM | POA: Diagnosis not present

## 2023-05-16 DIAGNOSIS — Z09 Encounter for follow-up examination after completed treatment for conditions other than malignant neoplasm: Secondary | ICD-10-CM | POA: Diagnosis present

## 2023-05-16 DIAGNOSIS — D128 Benign neoplasm of rectum: Secondary | ICD-10-CM | POA: Diagnosis not present

## 2023-05-16 DIAGNOSIS — D129 Benign neoplasm of anus and anal canal: Secondary | ICD-10-CM

## 2023-05-16 MED ORDER — SODIUM CHLORIDE 0.9 % IV SOLN: 500.0000 mL | INTRAVENOUS | Status: AC

## 2023-05-16 NOTE — Progress Notes (Signed)
Pt's states no medical or surgical changes since previsit or office visit. 

## 2023-05-16 NOTE — Progress Notes (Signed)
Called to room to assist during endoscopic procedure.  Patient ID and intended procedure confirmed with present staff. Received instructions for my participation in the procedure from the performing physician.  

## 2023-05-16 NOTE — Op Note (Signed)
[facility] Patient Name: Marcus Raymond Procedure Date: 05/16/2023 7:06 AM MRN: [id number] Endoscopist: Wilhemina Bonito. Marina Goodell , MD, [id number] Age: 67 Referring MD:  Date of Birth: [birth year] Gender: Male Account #: [id number] Procedure:                Colonoscopy with cold snare polypectomy x 2; biopsy                            polypectomy x 1 Indications:              High risk colon cancer surveillance: Personal                            history of non-advanced adenoma. Previous                            examinations 2008, 2014 Medicines:                Monitored Anesthesia Care Procedure:                Pre-Anesthesia Assessment:                           - Prior to the procedure, a History and Physical                            was performed, and patient medications and                            allergies were reviewed. The patient's tolerance of                            previous anesthesia was also reviewed. The risks                            and benefits of the procedure and the sedation                            options and risks were discussed with the patient.                            All questions were answered, and informed consent                            was obtained. Prior Anticoagulants: The patient has                            taken no anticoagulant or antiplatelet agents. ASA                            Grade Assessment: II - A patient with mild systemic                            disease. After reviewing the risks and benefits,  the patient was deemed in satisfactory condition to                            undergo the procedure.                           After obtaining informed consent, the colonoscope                            was passed under direct vision. Throughout the                            procedure, the patient's blood pressure, pulse, and                            oxygen saturations were monitored  continuously. The                            CF [id number] #[id number] was introduced through the anus                            and advanced to the the cecum, identified by                            appendiceal orifice and ileocecal valve. The                            ileocecal valve, appendiceal orifice, and rectum                            were photographed. The quality of the bowel                            preparation was excellent. The colonoscopy was                            performed without difficulty. The patient tolerated                            the procedure well. The bowel preparation used was                            SUPREP via split dose instruction. Scope In: 8:26:47 AM Scope Out: 8:42:24 AM Scope Withdrawal Time: 0 hours 12 minutes 17 seconds  Total Procedure Duration: 0 hours 15 minutes 37 seconds  Findings:                 Two polyps were found in the ascending colon and                            cecum. The polyps were 1 to 2 mm in size. These                            polyps were removed with a cold  snare. Resection                            and retrieval were complete.                           A 2 mm polyp was found in the rectum. The polyp was                            removed with a jumbo cold forceps. Resection and                            retrieval were complete.                           The exam was otherwise without abnormality on                            direct and retroflexion views. Complications:            No immediate complications. Estimated blood loss:                            None. Estimated Blood Loss:     Estimated blood loss: none. Recommendation:           - Repeat colonoscopy in 5 years if all polyps                            adenomatous, 7 years if 1 or 2 adenomatous, or 10                            years if no adenomas, for surveillance.                           - Patient has a contact number available for                             emergencies. The signs and symptoms of potential                            delayed complications were discussed with the                            patient. Return to normal activities tomorrow.                            Written discharge instructions were provided to the                            patient.                           - Resume previous diet.                           -  Continue present medications.                           - Await pathology results. Wilhemina Bonito. Marina Goodell, MD 05/16/2023 8:51:24 AM This report has been signed electronically.

## 2023-05-16 NOTE — Progress Notes (Signed)
Report to PACU, RN, vss, BBS= Clear.  

## 2023-05-16 NOTE — Progress Notes (Signed)
HISTORY OF PRESENT ILLNESS:  Marcus Raymond is a 67 y.o. male with a history of adenomatous colon polyps.  Presents today for surveillance colonoscopy.  No complaints  REVIEW OF SYSTEMS:  All non-GI ROS negative except for  Past Medical History:  Diagnosis Date   History of colon polyps 2008   Tubular Adenoma, Dr Marina Goodell , due 2013    Past Surgical History:  Procedure Laterality Date   COLONOSCOPY W/ POLYPECTOMY  09/05/2006   Tubular Adenoma   ORIF ELBOW FRACTURE  07/27/2012   Procedure: OPEN REDUCTION INTERNAL FIXATION (ORIF) ELBOW/OLECRANON FRACTURE;  Surgeon: [name], MD;  Location: [facility];  Service: Orthopedics;  Laterality: Left;  Open Reduction Internal Fixation left elbow olecranon   ORIF ELBOW FRACTURE  09/07/2012   Procedure: OPEN REDUCTION INTERNAL FIXATION (ORIF) ELBOW/OLECRANON FRACTURE;  Surgeon: [name], MD;  Location: [facility];  Service: Orthopedics;  Laterality: Left;  Revision open reduction internal fixation left elbow olecranon fracture   shoulder separated Left 2018    Social History Marcus Raymond  reports that he has never smoked. He has never used smokeless tobacco. He reports current alcohol use. He reports that he does not use drugs.  family history includes Colon polyps (age of onset: 42) in his father; Diabetes in his paternal uncle; Fibromyalgia in his sister; Hypertension in his mother; Skin cancer in his mother.  Allergies  Allergen Reactions   Sulfonamide Derivatives     Per pt: as child       PHYSICAL EXAMINATION: Vital signs: BP 133/71   Pulse 62   Temp 98.6 F (37 C) (Temporal)   Ht 5\' 6"  (1.676 m)   Wt 130 lb (59 kg)   SpO2 99%   BMI 20.98 kg/m  General: Well-developed, well-nourished, no acute distress HEENT: Sclerae are anicteric, conjunctiva pink. Oral mucosa intact Lungs: Clear Heart: Regular Abdomen: soft, nontender, nondistended, no obvious ascites, no peritoneal signs, normal bowel sounds. No  organomegaly. Extremities: No edema Psychiatric: alert and oriented x3. Cooperative     ASSESSMENT:  History of adenomatous polyps   PLAN:  Surveillance colonoscopy

## 2023-05-16 NOTE — Patient Instructions (Signed)
Discharge instructions given. Handout on polyps. Resume previous medications. YOU HAD AN ENDOSCOPIC PROCEDURE TODAY AT THE [facility]:   Refer to the procedure report that was given to you for any specific questions about what was found during the examination.  If the procedure report does not answer your questions, please call your gastroenterologist to clarify.  If you requested that your care partner not be given the details of your procedure findings, then the procedure report has been included in a sealed envelope for you to review at your convenience later.  YOU SHOULD EXPECT: Some feelings of bloating in the abdomen. Passage of more gas than usual.  Walking can help get rid of the air that was put into your GI tract during the procedure and reduce the bloating. If you had a lower endoscopy (such as a colonoscopy or flexible sigmoidoscopy) you may notice spotting of blood in your stool or on the toilet paper. If you underwent a bowel prep for your procedure, you may not have a normal bowel movement for a few days.  Please Note:  You might notice some irritation and congestion in your nose or some drainage.  This is from the oxygen used during your procedure.  There is no need for concern and it should clear up in a day or so.  SYMPTOMS TO REPORT IMMEDIATELY:  Following lower endoscopy (colonoscopy or flexible sigmoidoscopy):  Excessive amounts of blood in the stool  Significant tenderness or worsening of abdominal pains  Swelling of the abdomen that is new, acute  Fever of 100F or higher   For urgent or emergent issues, a gastroenterologist can be reached at any hour by calling [phone]. Do not use MyChart messaging for urgent concerns.    DIET:  We do recommend a small meal at first, but then you may proceed to your regular diet.  Drink plenty of fluids but you should avoid alcoholic beverages for 24 hours.  ACTIVITY:  You should plan to take it easy for the rest  of today and you should NOT DRIVE or use heavy machinery until tomorrow (because of the sedation medicines used during the test).    FOLLOW UP: Our staff will call the number listed on your records the next business day following your procedure.  We will call around 7:15- 8:00 am to check on you and address any questions or concerns that you may have regarding the information given to you following your procedure. If we do not reach you, we will leave a message.     If any biopsies were taken you will be contacted by phone or by letter within the next 1-3 weeks.  Please call us at [phone] if you have not heard about the biopsies in 3 weeks.    SIGNATURES/CONFIDENTIALITY: You and/or your care partner have signed paperwork which will be entered into your electronic medical record.  These signatures attest to the fact that that the information above on your After Visit Summary has been reviewed and is understood.  Full responsibility of the confidentiality of this discharge information lies with you and/or your care-partner. 

## 2023-05-17 ENCOUNTER — Telehealth: Payer: Self-pay | Admitting: *Deleted

## 2023-05-17 NOTE — Telephone Encounter (Signed)
  Follow up Call-     05/16/2023    7:15 AM  Call back number  Post procedure Call Back phone  # [phone]  Permission to leave phone message Yes   [facility]

## 2023-05-18 ENCOUNTER — Encounter: Payer: Self-pay | Admitting: Internal Medicine

## 2023-05-18 LAB — SURGICAL PATHOLOGY

## 2023-08-25 ENCOUNTER — Ambulatory Visit: Payer: BC Managed Care – PPO | Admitting: Orthopedic Surgery

## 2024-07-08 ENCOUNTER — Encounter: Payer: Self-pay | Admitting: Radiology
